# Patient Record
Sex: Male | Born: 1975 | Race: White | Hispanic: No | Marital: Single | Smoking: Current every day smoker
Health system: Southern US, Community
[De-identification: ages and names within clinical notes are randomized; demographics above are authoritative.]

## PROBLEM LIST (undated history)

## (undated) DIAGNOSIS — Z9151 Personal history of suicidal behavior: Secondary | ICD-10-CM

## (undated) DIAGNOSIS — F32A Depression, unspecified: Secondary | ICD-10-CM

## (undated) DIAGNOSIS — F209 Schizophrenia, unspecified: Secondary | ICD-10-CM

## (undated) DIAGNOSIS — F1910 Other psychoactive substance abuse, uncomplicated: Secondary | ICD-10-CM

## (undated) DIAGNOSIS — Z915 Personal history of self-harm: Secondary | ICD-10-CM

## (undated) DIAGNOSIS — F329 Major depressive disorder, single episode, unspecified: Secondary | ICD-10-CM

---

## 2013-02-25 ENCOUNTER — Emergency Department (HOSPITAL_COMMUNITY)
Admission: EM | Admit: 2013-02-25 | Discharge: 2013-02-25 | Disposition: A | Discharge status: Home / Self Care | Payer: Self-pay | Attending: Emergency Medicine | Admitting: Emergency Medicine

## 2013-02-25 ENCOUNTER — Encounter (HOSPITAL_COMMUNITY): Payer: Self-pay | Admitting: Emergency Medicine

## 2013-02-25 DIAGNOSIS — R21 Rash and other nonspecific skin eruption: Secondary | ICD-10-CM

## 2013-02-25 DIAGNOSIS — R2241 Localized swelling, mass and lump, right lower limb: Secondary | ICD-10-CM

## 2013-02-25 DIAGNOSIS — R229 Localized swelling, mass and lump, unspecified: Secondary | ICD-10-CM | POA: Insufficient documentation

## 2013-02-25 DIAGNOSIS — B351 Tinea unguium: Secondary | ICD-10-CM

## 2013-02-25 LAB — URINALYSIS, ROUTINE W REFLEX MICROSCOPIC
Bilirubin Urine: NEGATIVE
Glucose, UA: NEGATIVE mg/dL
HGB URINE DIPSTICK: NEGATIVE
KETONES UR: NEGATIVE mg/dL
Leukocytes, UA: NEGATIVE
Nitrite: NEGATIVE
Protein, ur: NEGATIVE mg/dL
Specific Gravity, Urine: 1.003 — ABNORMAL LOW (ref 1.005–1.030)
UROBILINOGEN UA: 0.2 mg/dL (ref 0.0–1.0)
pH: 5.5 (ref 5.0–8.0)

## 2013-02-25 MED ORDER — OXYCODONE-ACETAMINOPHEN 5-325 MG PO TABS
1.0000 | ORAL_TABLET | Freq: Once | ORAL | Status: AC
  Administered 2013-02-25: 1 via ORAL
  Filled 2013-02-25: qty 1

## 2013-02-25 MED ORDER — NAPROXEN 500 MG PO TABS: 500.0000 mg | ORAL_TABLET | Freq: Two times a day (BID) | ORAL | Status: DC

## 2013-02-25 MED ORDER — HYDROCORTISONE 2.5 % EX LOTN: TOPICAL_LOTION | Freq: Two times a day (BID) | CUTANEOUS | Status: DC

## 2013-02-25 MED ORDER — CLOTRIMAZOLE 1 % EX CREA: TOPICAL_CREAM | CUTANEOUS | Status: DC

## 2013-02-25 MED ORDER — OXYCODONE-ACETAMINOPHEN 5-325 MG PO TABS: 1.0000 | ORAL_TABLET | Freq: Four times a day (QID) | ORAL | Status: DC | PRN

## 2013-02-25 MED ORDER — TRAMADOL HCL 50 MG PO TABS: 50.0000 mg | ORAL_TABLET | Freq: Four times a day (QID) | ORAL | Status: DC | PRN

## 2013-02-25 MED ORDER — TERBINAFINE HCL 250 MG PO TABS: 250.0000 mg | ORAL_TABLET | Freq: Every day | ORAL | Status: DC

## 2013-02-25 NOTE — ED Notes (Signed)
Pt resting quietly at the time. Vital signs stable. 8/10 pain to R upper leg abscess. No signs of acute distress noted at present. Pt up walking around in room.

## 2013-02-25 NOTE — ED Notes (Signed)
Pt has golf ball size abscess on right leg, pt has pain there and also in his groin area

## 2013-02-25 NOTE — Discharge Instructions (Signed)
Follow up with Primary Care Physician regarding the mass of your leg.  Apply warm compresses to the area.  Take Ibuprofen or Naproxen for the pain.  Return to the Emergency Department if you develop redness or warmth of the area.  Return if you develop a fever.    Use hydrocortisone cream for the rash of your legs.   Emergency Department Resource Guide 1) Find a Doctor and Pay Out of Pocket Although you won't have to find out who is covered by your insurance plan, it is a good idea to ask around and get recommendations. You will then need to call the office and see if the doctor you have chosen will accept you as a new patient and what types of options they offer for patients who are self-pay. Some doctors offer discounts or will set up payment plans for their patients who do not have insurance, but you will need to ask so you aren't surprised when you get to your appointment.  2) Contact Your Local Health Department Not all health departments have doctors that can see patients for sick visits, but many do, so it is worth a call to see if yours does. If you don't know where your local health department is, you can check in your phone book. The CDC also has a tool to help you locate your state's health department, and many state websites also have listings of all of their local health departments.  3) Find a Walk-in Clinic If your illness is not likely to be very severe or complicated, you may want to try a walk in clinic. These are popping up all over the country in pharmacies, drugstores, and shopping centers. They're usually staffed by nurse practitioners or physician assistants that have been trained to treat common illnesses and complaints. They're usually fairly quick and inexpensive. However, if you have serious medical issues or chronic medical problems, these are probably not your best option.  No Primary Care Doctor: - Call Health Connect at  [phone] - they can help you locate a primary  care doctor that  accepts your insurance, provides certain services, etc. - Physician Referral Service- [phone]  Chronic Pain Problems: Organization         Address  Phone   Notes  [facility]  [phone]) [phone] Patients need to be referred by their primary care doctor.   Medication Assistance: Organization         Address  Phone   Notes  [facility] Medication [facility] [address] [place], [place] [phone]) [phone] --Must be a resident of [place] [facility] -- Must have NO insurance coverage whatsoever (no Medicaid/ Medicare, etc.) -- The pt. MUST have a primary care doctor that directs their care regularly and follows them in the community   MedAssist  [phone]) [phone]   [employer]  [phone]    Agencies that provide inexpensive medical care: Organization         Address  Phone   Notes  [facility]  [phone]) [phone]   [facility]    [phone]) [phone]   [facility] [address] [place], [place] [address] [phone]) [phone]   [facility] [address], [place] [phone]   Planned Parenthood    [phone]) [phone]   [facility]    [phone]) [phone]   [facility] and [facility]  [address], [place] Phone:  (  [phone], Fax:  [phone] Hours of Operation:  9 am - 6 pm, M-F.  Also accepts Medicaid/Medicare and self-pay.  [facility] for [place], [address], [place] Phone: [phone], Fax: [phone]. Hours of Operation:  8:30 am - 5:30 pm, M-F.  Also accepts Medicaid and self-pay.  [facility] [place] [address] [place] Phone: [phone]   [place], [place], [place] [phone], Ext. [phone] Mondays & Thursdays: 7-9 AM.  First 15 patients are seen on a first come, first serve basis.    [place]  Providers:  Organization         Address  Phone   Notes  [facility] [address], [place] [phone] Also accepts self-pay patients.  [facility] [address], [place]  [phone]   [place], [address], [place] [phone]   [facility] [address], [place] [phone]   Lucianne Lei [address], [place]   [phone] Only accepts [place] Access [place] patients after they have their name applied to their card.   Self-Pay (no insurance) in [facility]:  Organization         Address  Phone   Notes  Sickle Cell Patients, [facility] [place] [phone]   [facility] [place] [phone]   [facility] [place]  [place], [place], [place] [phone]   [facility]/Dr. Osei-Bonsu  [address] [place] or [address], [place] [phone] Phone number for both [place] and [place] locations is the same.  [facility] [address], [place] [phone]   [facility] [address] [place] or [address] [phone]   [facility] [address] [place] [phone], phone; [phone], fax Sees patients 1st and 3rd Saturday of every month.  Must not qualify for public or private insurance (i.e. Medicaid, Medicare, [place] Health Choice, Veterans' Benefits)  Household income should be no more than 200% of the poverty level The clinic cannot treat you if you are pregnant or think you are pregnant  Sexually transmitted diseases are not treated at the clinic.    Dental Care: Organization         Address  Phone  Notes  [facility] [phone] Accepts children up to age 2 who are enrolled in [place] or [place]; pregnant women with a Medicaid card; and children who have applied for Medicaid or [place] Health Choice, but were declined, whose parents can pay a reduced fee at time of service.  [facility] of [facility]  [address], [place] [phone] Accepts children up to age 72 who are enrolled in [place] or [place]; pregnant women with a Medicaid card; and children who have applied for Medicaid or [place] Health Choice, but were declined, whose parents can pay a reduced fee at time of service.  [facility] Access PROGRAM  [place] [phone] Patients are seen by appointment only. Walk-ins  are not accepted. [place] will see patients 39 years of age and older. Monday - Tuesday (8am-5pm) Most Wednesdays (8:30-5pm) $30 per visit, cash only  [facility] Adult Dental Access PROGRAM  [address], [facility] [phone] Patients are seen by appointment only. Walk-ins are not accepted. [place] [facility] will see patients 56 years of age and older. One Wednesday Evening (Monthly: Volunteer Based).  $30 per visit, cash only  [place]  [phone] for adults; Children under age 3, call Graduate Pediatric Dentistry at [phone]. Children aged 85-14, please call [phone] to request a pediatric application.  Dental services are provided in all areas of dental care including fillings, crowns and bridges, complete and partial dentures, implants, gum treatment, root canals, and extractions. Preventive care is also provided. Treatment is provided to both adults and children. Patients are selected via a lottery and there is often a waiting list.   [facility] [address], [place]  [phone] [email or web address]   [facility]  [address] [place], [place] [phone], Ext. [phone] Second and Fourth Thursday of each month, opens at 6:30 AM; Clinic ends at 9 AM.  Patients are seen on a first-come first-served basis, and a limited number are seen during each clinic.   [facility]  [address] Hillard Danker [place], [place] [phone]   Eligibility Requirements You must have lived in [place], [place], or [place] counties for at least the last three months.   You cannot be eligible for state or federal sponsored [employer], including [employer], [place], or [employer].   You generally cannot be eligible for healthcare insurance through your employer.    How to apply: Eligibility screenings are held every Tuesday and Wednesday afternoon from 1:00 pm until 4:00 pm. You do not need an appointment for the interview!  [facility] [address] [place], [place]   [place]  [place] Department  [place]  [phone]    Behavioral Health Resources in the Community: Intensive Outpatient Programs Organization         Address  Phone  Notes  [place]. [address] [place], [place] [phone]   [facility] Outpatient [address] [place], [place]   ADS: Alcohol & Drug Svcs [address], [place], [place]   [place] [address],  [place], [place] or [phone]   Substance Abuse Resources Organization         Address  Phone  Notes  Alcohol and Drug Services  [phone]   [place]  [phone]   [facility]   Chinita Pester  [phone]   Residential & Outpatient Substance Abuse Program  [phone]   Psychological Services Organization         Address  Phone  Notes  [facility]  [phone]   [place] [address] [place] [phone] or [phone]    Mobile Crisis Teams Organization         Address  Phone  Notes  [facility], Mobile Crisis Care Unit  [phone]   [facility] Psychotherapeutic [facility]  [address] [place], [place]   [facility] [address], [place] [phone]  Self-Help/Support Groups Organization         Address  Phone             Notes  [facility] [place] - variety of support groups  336- [phone] Call for more information  Narcotics Anonymous (NA), [facility] [address], [place]  2 meetings at this location   Statisticianesidential Treatment Programs Organization         Address  Phone  Notes  [facility] [address],    [place]  [phone]   [facility]  [address], [place] [phone], [place], [place] [phone]   [facility] [address] [place] [address], [place] [phone] Admissions: 8am-3pm M-F  [facility] [address]    [place], [place] [phone]   [facility] [address], [place], [place] [phone]   [facility] [address]  [place], [place] [phone]   [facility] - Intensive Outpatient [address], [place], [place] [phone]   [facility] ([facility] [address]  [place], [place] [phone] or [phone]   [facility] (RTS) [address] [place], [place] [phone] Accepts Medicaid  [facility] [address]  [place] [phone] Substance Abuse/Addiction Treatment   [facility] Behavioral Health Resources Organization         Address  Phone  Notes  [facility]  [phone]) [phone]   Angie FavaJulie Brannon, PhD [address], [address] [place], [place]   [phone] or [phone](336) [phone]    [facility]   [address] [place], [place] [phone](336) [phone]   [facility] [address], [place], [place] [phone]) [phone] Insurance/Medicaid/sponsorship through [facility]  [facility] Families [address]                                    [place], [place] [phone]) [phone] Therapy/tele-psych/case  [facility] [address].   [place], [place] [phone](336) [phone]    Dr. Lolly MustacheArfeen  [phone]) [phone]   [facility]  [employer] Way [facility] 1) [address], [place] 2) [address], [place] 3)  [address], [place] [phone]) [phone](336) [phone]  [phone](336) [phone]   [facility] Child Abuse Hotline [phone](336) [phone] or [phone](336) [phone] (After Hours)

## 2013-02-25 NOTE — ED Provider Notes (Signed)
Medical screening examination/treatment/procedure(s) were performed by non-physician practitioner and as supervising physician I was immediately available for consultation/collaboration.  EKG Interpretation   None         Tania Steinhauser H Emanual Lamountain, MD 02/25/13 2258 

## 2013-02-25 NOTE — ED Provider Notes (Signed)
CSN: [id number]     Arrival date & time 02/25/13  0544 History   First MD Initiated Contact with Patient 02/25/13 0606     Chief Complaint  Patient presents with  . Abscess    Right leg   (Consider location/radiation/quality/duration/timing/severity/associated sxs/prior Treatment) HPI Comments: Patient presents with a tender bump on his right lateral thigh.  He reports that the area has been there for the past 2 months and is gradually increasing in size.  He has been taking Ibuprofen for the pain, but does not feel that it helps.  He has not noticed any redness or warmth of the area.  He denies any drainage from the area.  No injury.  He denies fever or chills. Patient also is complaining of a rash to both legs that has been present for the past 4 days.  Rash does itch.  He has not tried any treatment for the rash.  He denies any new use of medications, detergents, lotions, or soaps.  No contacts with similar rash. He is also complaining of a yellow discoloration and thickening of his toe nails that has also been present for months.  No treatment prior to arrival. Patient has not been seen by a medical provider for any of the above complaints.    The history is provided by the patient.    History reviewed. No pertinent past medical history. History reviewed. No pertinent past surgical history. History reviewed. No pertinent family history. History  Substance Use Topics  . Smoking status: Not on file  . Smokeless tobacco: Not on file  . Alcohol Use: Not on file    Review of Systems  All other systems reviewed and are negative.    Allergies  Review of patient's allergies indicates no known allergies.  Home Medications   Current Outpatient Rx  Name  Route  Sig  Dispense  Refill  . ibuprofen (ADVIL,MOTRIN) 200 MG tablet   Oral   Take 200 mg by mouth every 6 (six) hours as needed for moderate pain.          BP 122/87  Pulse 88  Temp(Src) 98.7 F (37.1 C) (Oral)  Resp 18   SpO2 99% Physical Exam  Nursing note and vitals reviewed. Constitutional: He appears well-developed and well-nourished.  HENT:  Head: Normocephalic and atraumatic.  Mouth/Throat: Oropharynx is clear and moist.  Neck: Normal range of motion. Neck supple.  Cardiovascular: Normal rate, regular rhythm and normal heart sounds.   Pulses:      Dorsalis pedis pulses are 2+ on the right side, and 2+ on the left side.       Posterior tibial pulses are 2+ on the right side, and 2+ on the left side.  Pulmonary/Chest: Effort normal and breath sounds normal.  Musculoskeletal: Normal range of motion.  Neurological: He is alert.  Normal sensation of the right foot  Skin: Skin is warm and dry. No erythema.  Approximately 3-4 cm soft tissue mass of the right anterior thigh.  No fluctuance.  No erythema or warmth.  Area visualized with the bedside ultrasound.  No fluid collection visualized.  Surrounding area soft.    Diffuse erythematous papular rash of both legs. No drainage.  Thickening and yellow discoloration of all toes of both feet.  Psychiatric: He has a normal mood and affect.    ED Course  Procedures (including critical care time) Labs Review Labs Reviewed  URINALYSIS, ROUTINE W REFLEX MICROSCOPIC - Abnormal; Notable for the following:  Specific Gravity, Urine 1.003 (*)    All other components within normal limits   Imaging Review No results found.  EKG Interpretation   None       MDM   1. Mass of leg, right   2. Rash   3. Onychomycosis of toenail     Patient with several complaints.  He reports that he has had a bump on his right thigh for 2 months.  On exam, no fluctuance of the area.  No overlying erythema or warmth.  Area visualized with bedside ultrasound.  No fluid collection visualized.  No not feel that incision and drainage is indicated at this time.  Area not consistent with abscess.  Patient also with a rash of both legs.  Appearance of rash most consistent with  Contact Dermatitis.  Patient given Hydrocortisone cream to apply to rash.  Patient also with Onychomycosis of toenails.  Patient given Rx for antifungal.  Patient is stable for discharge.  Return precautions given.    Santiago GladHeather Maryanna Stuber, PA-C 02/25/13 1614

## 2013-03-31 ENCOUNTER — Emergency Department (HOSPITAL_COMMUNITY): Payer: Self-pay

## 2013-03-31 ENCOUNTER — Encounter (HOSPITAL_COMMUNITY): Payer: Self-pay | Admitting: Emergency Medicine

## 2013-03-31 ENCOUNTER — Emergency Department (HOSPITAL_COMMUNITY)
Admission: EM | Admit: 2013-03-31 | Discharge: 2013-03-31 | Disposition: A | Discharge status: Home / Self Care | Payer: Self-pay | Attending: Emergency Medicine | Admitting: Emergency Medicine

## 2013-03-31 DIAGNOSIS — R1031 Right lower quadrant pain: Secondary | ICD-10-CM

## 2013-03-31 DIAGNOSIS — R109 Unspecified abdominal pain: Secondary | ICD-10-CM | POA: Insufficient documentation

## 2013-03-31 DIAGNOSIS — M25559 Pain in unspecified hip: Secondary | ICD-10-CM | POA: Insufficient documentation

## 2013-03-31 DIAGNOSIS — F17200 Nicotine dependence, unspecified, uncomplicated: Secondary | ICD-10-CM | POA: Insufficient documentation

## 2013-03-31 LAB — URINALYSIS, ROUTINE W REFLEX MICROSCOPIC
Bilirubin Urine: NEGATIVE
Glucose, UA: NEGATIVE mg/dL
Hgb urine dipstick: NEGATIVE
Ketones, ur: 15 mg/dL — AB
Leukocytes, UA: NEGATIVE
Nitrite: NEGATIVE
Protein, ur: NEGATIVE mg/dL
Specific Gravity, Urine: 1.029 (ref 1.005–1.030)
Urobilinogen, UA: 0.2 mg/dL (ref 0.0–1.0)
pH: 5.5 (ref 5.0–8.0)

## 2013-03-31 LAB — POCT I-STAT, CHEM 8
BUN: 14 mg/dL (ref 6–23)
Calcium, Ion: 1.13 mmol/L (ref 1.12–1.23)
Chloride: 107 meq/L (ref 96–112)
Creatinine, Ser: 1.2 mg/dL (ref 0.50–1.35)
Glucose, Bld: 87 mg/dL (ref 70–99)
HCT: 51 % (ref 39.0–52.0)
Hemoglobin: 17.3 g/dL — ABNORMAL HIGH (ref 13.0–17.0)
Potassium: 3.5 meq/L — ABNORMAL LOW (ref 3.7–5.3)
Sodium: 145 meq/L (ref 137–147)
TCO2: 20 mmol/L (ref 0–100)

## 2013-03-31 MED ORDER — OXYCODONE-ACETAMINOPHEN 5-325 MG PO TABS
2.0000 | ORAL_TABLET | ORAL | Status: DC | PRN
Start: 2013-03-31 — End: 2013-04-16

## 2013-03-31 MED ORDER — NAPROXEN 500 MG PO TABS: 500.0000 mg | ORAL_TABLET | Freq: Two times a day (BID) | ORAL | Status: DC

## 2013-03-31 MED ORDER — IOHEXOL 300 MG/ML  SOLN
100.0000 mL | Freq: Once | INTRAMUSCULAR | Status: AC | PRN
  Administered 2013-03-31: 100 mL via INTRAVENOUS

## 2013-03-31 MED ORDER — OXYCODONE-ACETAMINOPHEN 5-325 MG PO TABS
2.0000 | ORAL_TABLET | Freq: Once | ORAL | Status: AC
  Administered 2013-03-31: 2 via ORAL
  Filled 2013-03-31: qty 2

## 2013-03-31 MED ORDER — IOHEXOL 300 MG/ML  SOLN
25.0000 mL | Freq: Once | INTRAMUSCULAR | Status: AC | PRN
  Administered 2013-03-31: 25 mL via ORAL

## 2013-03-31 NOTE — ED Notes (Signed)
Vital signs stable. 

## 2013-03-31 NOTE — ED Notes (Signed)
Per ems-- pt reports R groin pain radiating to hip, with dark colored urine and pain with urination x3 weeks. . No hx kidney stones.

## 2013-03-31 NOTE — ED Provider Notes (Signed)
CSN: [id number]     Arrival date & time 03/31/13  0037 History   First MD Initiated Contact with Patient 03/31/13 0114     Chief Complaint  Patient presents with  . Groin Pain   (Consider location/radiation/quality/duration/timing/severity/associated sxs/prior Treatment) HPI 38 yo male presents to the ER via EMS with complaint of right groin pain.  Pain has been ongoing for the last 4-5 weeks.  No known trauma or injury to the area.  Pain is worse with movement, but is always present.  Some radiation of pain down the inside of his thigh.  Occasionally he has noted darker urine.  Occasionally he has had some burning with urination.  No new sexual partners, no penile discharge.  Pt works as Administratorlandscaper.  No abdominal pain, no flank or back pain, no weakness, numbness, bowel changes or erectile problems.  No scrotal bulges or pain.  He has noticed some lumps in the groin crease.  History reviewed. No pertinent past medical history. History reviewed. No pertinent past surgical history. No family history on file. History  Substance Use Topics  . Smoking status: Current Every Day Smoker  . Smokeless tobacco: Not on file  . Alcohol Use: Yes     Comment: occasionally    Review of Systems  See History of Present Illness; otherwise all other systems are reviewed and negative Allergies  Review of patient's allergies indicates no known allergies.  Home Medications   Current Outpatient Rx  Name  Route  Sig  Dispense  Refill  . ibuprofen (ADVIL,MOTRIN) 200 MG tablet   Oral   Take 400 mg by mouth every 8 (eight) hours as needed for mild pain or moderate pain.           BP 123/78  Pulse 79  Temp(Src) 98.4 F (36.9 C) (Oral)  Resp 16  SpO2 100% Physical Exam  Nursing note and vitals reviewed. Constitutional: He is oriented to person, place, and time. He appears well-developed and well-nourished.  HENT:  Head: Normocephalic and atraumatic.  Right Ear: External ear normal.  Left Ear:  External ear normal.  Nose: Nose normal.  Mouth/Throat: Oropharynx is clear and moist.  Eyes: Conjunctivae and EOM are normal. Pupils are equal, round, and reactive to light.  Neck: Normal range of motion. Neck supple. No JVD present. No tracheal deviation present. No thyromegaly present.  Cardiovascular: Normal rate, regular rhythm, normal heart sounds and intact distal pulses.  Exam reveals no gallop and no friction rub.   No murmur heard. Pulmonary/Chest: Effort normal and breath sounds normal. No stridor. No respiratory distress. He has no wheezes. He has no rales. He exhibits no tenderness.  Abdominal: Soft. Bowel sounds are normal. He exhibits no distension and no mass. There is no tenderness. There is no rebound and no guarding.  Genitourinary:  Pain with palpation of right inguinal crease.  Shoddy LAD noted.  No hernia.  Normal scrotum, penis.    Musculoskeletal: Normal range of motion. He exhibits tenderness (ttp along right interior thigh). He exhibits no edema.  Lymphadenopathy:    He has no cervical adenopathy.  Neurological: He is alert and oriented to person, place, and time. He exhibits normal muscle tone. Coordination normal.  Skin: Skin is warm and dry. No rash noted. No erythema. No pallor.  Scattered abrasions to lower extremities  Psychiatric: He has a normal mood and affect. His behavior is normal. Judgment and thought content normal.    ED Course  Procedures (including critical care time) Labs  Review Labs Reviewed  URINALYSIS, ROUTINE W REFLEX MICROSCOPIC - Abnormal; Notable for the following:    Ketones, ur 15 (*)    All other components within normal limits  POCT I-STAT, CHEM 8 - Abnormal; Notable for the following:    Potassium 3.5 (*)    Hemoglobin 17.3 (*)    All other components within normal limits   Imaging Review Ct Abdomen Pelvis W Contrast  03/31/2013   CLINICAL DATA:  Groin pain and lymphadenopathy. Concern for malignancy.  EXAM: CT ABDOMEN AND  PELVIS WITH CONTRAST  TECHNIQUE: Multidetector CT imaging of the abdomen and pelvis was performed using the standard protocol following bolus administration of intravenous contrast.  CONTRAST:  OMNIPAQUE IOHEXOL 300 MG/ML  SOLN  COMPARISON:  None.  FINDINGS: BODY WALL: Unremarkable.  LOWER CHEST: Unremarkable.  ABDOMEN/PELVIS:  Liver: No focal abnormality.  Biliary: Cholelithiasis.  No acute cholecystitis.  Pancreas: Unremarkable.  Spleen: Unremarkable.  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  Kidneys and ureters: 17 mm simple appearing cyst in the lower pole right kidney. 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 There is no hydronephrosis or nephrolithiasis.  Bladder: Unremarkable.  Reproductive: Unremarkable.  Bowel: No obstruction. Normal appendix.  Retroperitoneum: No mass or adenopathy.  Peritoneum: No free fluid or gas.  Vascular: No acute abnormality.  OSSEOUS: No acute abnormalities. Benign-appearing sclerotic and lucent foci in the pelvis and proximal femora.  IMPRESSION: 1. No acute intra-abdominal abnormality.  No adenopathy. 2. Cholelithiasis without acute cholecystitis. 3. Ptotic left kidney.   Electronically Signed   By: Tiburcio Pea M.D.   On: 03/31/2013 03:52    EKG Interpretation   None       MDM   1. Right inguinal pain   2. Right groin pain    38 yo male with right groin pain.  Urine and chem 7 normal.  Some shoddy LAD to right groin.  Suspect reactive given abrasions to lower extremities, but I do have some concern for malignancy.  Will get contrasted abd/pelvis CT scan, treat pain.  5:14 AM Pain much improved.  Abnormal left kidney noted, cyst on right.  No signs of malignancy.  Will have patient f/u with ortho for probable groin strain.    Olivia Mackie, MD 03/31/13 [phone]

## 2013-03-31 NOTE — Discharge Instructions (Signed)
Your CAT scan does not show a specific cause for your right groin pain.  Your left kidney is in an abnormal position, but this is most likely to have been present since birth.  Take medications as prescribed.  Followup with orthopedic surgery for further workup and evaluation of your ongoing pain.  You may consider being seen by urology.  Given the abnormal position of your kidney.  Return to the emergency room for worsening condition or new concerning symptoms.

## 2013-03-31 NOTE — ED Notes (Signed)
Admitting MD at bedside.

## 2013-04-16 ENCOUNTER — Emergency Department (HOSPITAL_COMMUNITY)
Admission: EM | Admit: 2013-04-16 | Discharge: 2013-04-17 | Disposition: A | Discharge status: Home / Self Care | Payer: Self-pay | Attending: Emergency Medicine | Admitting: Emergency Medicine

## 2013-04-16 ENCOUNTER — Encounter (HOSPITAL_COMMUNITY): Payer: Self-pay | Admitting: Emergency Medicine

## 2013-04-16 DIAGNOSIS — M7918 Myalgia, other site: Secondary | ICD-10-CM

## 2013-04-16 DIAGNOSIS — R109 Unspecified abdominal pain: Secondary | ICD-10-CM | POA: Insufficient documentation

## 2013-04-16 DIAGNOSIS — R1030 Lower abdominal pain, unspecified: Secondary | ICD-10-CM

## 2013-04-16 DIAGNOSIS — IMO0001 Reserved for inherently not codable concepts without codable children: Secondary | ICD-10-CM | POA: Insufficient documentation

## 2013-04-16 DIAGNOSIS — F17200 Nicotine dependence, unspecified, uncomplicated: Secondary | ICD-10-CM | POA: Insufficient documentation

## 2013-04-16 MED ORDER — OXYCODONE-ACETAMINOPHEN 5-325 MG PO TABS: 2.0000 | ORAL_TABLET | ORAL | Status: DC | PRN

## 2013-04-16 MED ORDER — IBUPROFEN 600 MG PO TABS: 600.0000 mg | ORAL_TABLET | Freq: Four times a day (QID) | ORAL | Status: DC | PRN

## 2013-04-16 NOTE — ED Notes (Signed)
Pt states that he has been having groin pain x "few months". Pt states that he has seen a doctor, and they gave him pain medication. Pt states "I don't need that I just need to get this fixed".

## 2013-04-16 NOTE — ED Notes (Signed)
The pt is c/o rt groin pain for several months and he has been seen here for the same

## 2013-04-16 NOTE — ED Provider Notes (Addendum)
CSN: [id number]     Arrival date & time 04/16/13  1933 History   None    Chief Complaint  Patient presents with  . Groin Pain     (Consider location/radiation/quality/duration/timing/severity/associated sxs/prior Treatment) HPI  This patient is a generally healthy 38 year old man who presents to the emergency department for the third time in the past couple of months with complaints of vague right-sided groin pain. He denies any history of trauma or strain. He was worked up very thoroughly for this complaint by Dr. Norlene Campbelltter about one month ago. He is CT of the pelvis to evaluate for possible malignancy in light of right inguinal adenopathy. as was essentially normal study. Patient had a normal U/A.   The patient says his pain persistent. It is worse with walking. He wants relieve. It still hurts. He has not followed up with any of the resources/providers to whom he has been referred.   Pain is aching, moderately severe, non radiating.  History reviewed. No pertinent past medical history. History reviewed. No pertinent past surgical history. No family history on file. History  Substance Use Topics  . Smoking status: Current Every Day Smoker  . Smokeless tobacco: Not on file  . Alcohol Use: Yes     Comment: occasionally    Review of Systems Ten point review of symptoms performed and is negative with the exception of symptoms noted above.     Allergies  Review of patient's allergies indicates no known allergies.  Home Medications   Current Outpatient Rx  Name  Route  Sig  Dispense  Refill  . ibuprofen (ADVIL,MOTRIN) 200 MG tablet   Oral   Take 400 mg by mouth every 8 (eight) hours as needed for mild pain or moderate pain.           BP 113/69  Pulse 87  Temp(Src) 97.9 F (36.6 C) (Oral)  Resp 20  SpO2 97% Physical Exam Gen: well developed and well nourished appearing Head: NCAT Eyes: PERL, EOMI Nose: no epistaixis or rhinorrhea Mouth/throat: mucosa is moist and  pink Neck: supple, no stridor Lungs: CTA B, no wheezing, rhonchi or rales CV: RRR, no murmur, extremities appear well perfused.  Abd: soft, notender, nondistended Back: no ttp, no cva ttp Skin: warm and dry GU: normal appearing circumsized penis without discharge at meatus, no inguinal adenopathy appreciated. NO lesions.  Ext: normal to inspection, ttp on palpation of the psoas and ileopsoas on the right. Pain with external and internal rotation (passively).  FROM at hip, NVI,  no dependent edema Neuro: CN ii-xii grossly intact, no focal deficits Psyche; normal affect,  calm and cooperative.   ED Course  Procedures (including critical care time) Labs Review   MDM   Maxwell [name] is here for the third time in the past 60 days with complaints of the same day right sided groin pain. His evaluation is consistent with hip flexor m. Strain. I have educated him on some basic stretching excersizes and suggested that he explore yoga or physical therapy for more in depth tx of muscular pain. I've explained to him that we have exhausted our resources for any further emergent eval of his pain in the emergency department. We will treat his acute pain. I have described for him the importance of outpatient followup with primary care provider. He is stable for discharge    Brandt LoosenJulie Baer Hinton, MD 04/17/13 0001  Brandt LoosenJulie Cherron Blitzer, MD 04/17/13 [phone]

## 2013-04-16 NOTE — ED Notes (Signed)
Lavella LemonsManly, MD and Lars MageI Knapp, MD made aware of the pt's wait time.

## 2013-04-17 MED ORDER — OXYCODONE-ACETAMINOPHEN 5-325 MG PO TABS
1.0000 | ORAL_TABLET | Freq: Once | ORAL | Status: AC
  Administered 2013-04-17: 1 via ORAL
  Filled 2013-04-17: qty 1

## 2013-04-25 ENCOUNTER — Encounter (HOSPITAL_COMMUNITY): Payer: Self-pay | Admitting: Emergency Medicine

## 2013-04-25 ENCOUNTER — Emergency Department (HOSPITAL_COMMUNITY)
Admission: EM | Admit: 2013-04-25 | Discharge: 2013-04-26 | Disposition: A | Discharge status: Home / Self Care | Payer: Self-pay | Attending: Emergency Medicine | Admitting: Emergency Medicine

## 2013-04-25 DIAGNOSIS — R05 Cough: Secondary | ICD-10-CM | POA: Insufficient documentation

## 2013-04-25 DIAGNOSIS — R229 Localized swelling, mass and lump, unspecified: Secondary | ICD-10-CM | POA: Insufficient documentation

## 2013-04-25 DIAGNOSIS — R2240 Localized swelling, mass and lump, unspecified lower limb: Secondary | ICD-10-CM

## 2013-04-25 DIAGNOSIS — R059 Cough, unspecified: Secondary | ICD-10-CM | POA: Insufficient documentation

## 2013-04-25 DIAGNOSIS — F17200 Nicotine dependence, unspecified, uncomplicated: Secondary | ICD-10-CM | POA: Insufficient documentation

## 2013-04-25 NOTE — ED Notes (Signed)
Pt states he has a knot on his right thigh for one week.  Area is not warm to touch or reddened, but it tender to palpation.

## 2013-04-25 NOTE — ED Provider Notes (Signed)
CSN: [id number]     Arrival date & time 04/25/13  2243 History  This chart was scribed for non-physician practitioner Lowella DellGray Anayia Eugene, PA-C working with Layla MawKristen N Ward, DO by Danella Maiersaroline Early, ED Scribe. This patient was seen in room TR09C/TR09C and the patient's care was started at 11:57 PM.    Chief Complaint  Patient presents with  . Leg Pain   The history is provided by the patient. No language interpreter was used.   HPI Comments: Maxwell [name] is a 38 y.o. male who presents to the Emergency Department complaining of gradually-worsening, constant, right outer thigh pain with a knot to the area for the past week. He rates the severity of the pain as an 8/10. He states the pain worsens when he applies pressure. He denies new sexual partners, penile discharge, testicular pain, dysuria, fever chills nausea vomiting dizziness.      History reviewed. No pertinent past medical history. History reviewed. No pertinent past surgical history. No family history on file. History  Substance Use Topics  . Smoking status: Current Every Day Smoker    Types: Cigarettes  . Smokeless tobacco: Not on file  . Alcohol Use: Yes     Comment: occasionally    Review of Systems  Respiratory: Positive for cough.   All other systems reviewed and are negative.      Allergies  Review of patient's allergies indicates no known allergies.  Home Medications   Current Outpatient Rx  Name  Route  Sig  Dispense  Refill  . HYDROcodone-acetaminophen (NORCO/VICODIN) 5-325 MG per tablet   Oral   Take 1-2 tablets by mouth every 4 (four) hours as needed.   6 tablet   0   . naproxen (NAPROSYN) 500 MG tablet   Oral   Take 1 tablet (500 mg total) by mouth 2 (two) times daily with a meal.   30 tablet   0    BP 132/68  Pulse 91  Temp(Src) 97.6 F (36.4 C) (Oral)  Resp 18  SpO2 98% Physical Exam  Nursing note and vitals reviewed. Constitutional: He is oriented to person, place, and time. He appears  well-developed and well-nourished. No distress.  HENT:  Head: Normocephalic and atraumatic.  Eyes: Conjunctivae are normal.  Neck: No JVD present. No tracheal deviation present.  Cardiovascular: Normal rate and regular rhythm.  Exam reveals no gallop and no friction rub.   No murmur heard. Pulmonary/Chest: Effort normal. No respiratory distress. He has no wheezes. He has no rhonchi. He has no rales.  Musculoskeletal: Normal range of motion. He exhibits no edema.  Neurological: He is alert and oriented to person, place, and time.  Skin: Skin is warm and dry. He is not diaphoretic.     Psychiatric: He has a normal mood and affect. His behavior is normal.    ED Course  US bedside Date/Time: 04/25/2013 11:54 PM Performed by: Rudene AndaLACKEY, Leani Myron, GRAY Authorized by: Rudene AndaLACKEY, Myshawn [place]ir[place], GRAY Consent: Verbal consent obtained. Risks and benefits: risks, benefits and alternatives were discussed Consent given by: patient Patient identity confirmed: verbally with patient Time out: Immediately prior to procedure a "time out" was called to verify the correct patient, procedure, equipment, support staff and site/side marked as required. Preparation: Patient was prepped and draped in the usual sterile fashion. Patient tolerance: Patient tolerated the procedure well with no immediate complications. Comments: Right lateral mid thigh Ultrasound reveals no evidence of fluid or abscess formation.    (including critical care time) Medications - No data  to display  DIAGNOSTIC STUDIES: Oxygen Saturation is 98% on RA, normal by my interpretation.    COORDINATION OF CARE: 12:00 AM- Discussed treatment plan with pt which includes bedside US. Pt agrees to plan.    Labs Review Labs Reviewed - No data to display Imaging Review No results found.   EKG Interpretation None      MDM   Final diagnoses:  Leg mass   Patient afebrile with normal VS.  NO evidence of infections, abscess, or cellulitis  Plan  to treat patient pain and have follow up with General Surgery for further evaluation and management of mass.   Patient agrees with plan. Discharged in good condition.   Meds given in ED:  Medications - No data to display  Discharge Medication List as of 04/26/2013 12:32 AM    START taking these medications   Details  traMADol (ULTRAM) 50 MG tablet Take 1 tablet (50 mg total) by mouth every 6 (six) hours as needed., Starting 04/26/2013, Until Discontinued, Print       I personally performed the services described in this documentation, which was scribed in my presence. The recorded information has been reviewed and is accurate.   Rudene Anda, PA-C 04/28/13 1313

## 2013-04-26 MED ORDER — HYDROCODONE-ACETAMINOPHEN 5-325 MG PO TABS: 1.0000 | ORAL_TABLET | ORAL | Status: DC | PRN

## 2013-04-26 MED ORDER — TRAMADOL HCL 50 MG PO TABS: 50.0000 mg | ORAL_TABLET | Freq: Four times a day (QID) | ORAL | Status: DC | PRN

## 2013-04-26 MED ORDER — NAPROXEN 500 MG PO TABS: 500.0000 mg | ORAL_TABLET | Freq: Two times a day (BID) | ORAL | Status: DC

## 2013-04-26 NOTE — Discharge Instructions (Signed)
Make appointment with General Surgery for further evaluation and management of right leg mass. Return to ED should you develop any worsening symptoms.    Emergency Department Resource Guide 1) Find a Doctor and Pay Out of Pocket Although you won't have to find out who is covered by your insurance plan, it is a good idea to ask around and get recommendations. You will then need to call the office and see if the doctor you have chosen will accept you as a new patient and what types of options they offer for patients who are self-pay. Some doctors offer discounts or will set up payment plans for their patients who do not have insurance, but you will need to ask so you aren't surprised when you get to your appointment.  2) Contact Your Local Health Department Not all health departments have doctors that can see patients for sick visits, but many do, so it is worth a call to see if yours does. If you don't know where your local health department is, you can check in your phone book. The CDC also has a tool to help you locate your state's health department, and many state websites also have listings of all of their local health departments.  3) Find a Walk-in Clinic If your illness is not likely to be very severe or complicated, you may want to try a walk in clinic. These are popping up all over the country in pharmacies, drugstores, and shopping centers. They're usually staffed by nurse practitioners or physician assistants that have been trained to treat common illnesses and complaints. They're usually fairly quick and inexpensive. However, if you have serious medical issues or chronic medical problems, these are probably not your best option.  No Primary Care Doctor: - Call Health Connect at  [phone] - they can help you locate a primary care doctor that  accepts your insurance, provides certain services, etc. - Physician Referral Service- [phone]  Chronic Pain Problems: Organization          Address  Phone   Notes  [facility]  [phone] Patients need to be referred by their primary care doctor.   Medication Assistance: Organization         Address  Phone   Notes  [facility] [address] [place], [place] [address] [phone] --Must be a resident of [place] [place] -- Must have NO insurance coverage whatsoever (no Medicaid/ Medicare, etc.) -- The pt. MUST have a primary care doctor that directs their care regularly and follows them in the community   MedAssist  [phone]   [employer]  [phone]    Agencies that provide inexpensive medical care: Organization         Address  Phone   Notes  [facility]  [phone]   [facility]    [phone]   [facility] [address] [place], [place] [address] [phone]   [facility] [address], [place] [phone]) [phone]   Planned Parenthood    [phone]   [facility]    [phone]   [facility] and [facility]  [address], [place] Phone:  [phone], Fax:  [phone] Hours of Operation:  9 am - 6 pm, M-F.  Also accepts Medicaid/Medicare and self-pay.  [facility]  [address], [place] Phone: (  [phone], Fax: [phone]. Hours of Operation:  8:30 am - 5:30 pm, M-F.  Also accepts Medicaid and self-pay.  [facility] [place] [address], [place] Phone: [phone]   [place], [place], [place] [phone], Ext. [phone] Mondays & Thursdays: 7-9 AM.  First 15 patients are seen on a first come, first serve basis.    [place] Providers:  Organization         Address  Phone   Notes  [facility] [address], [place] [phone] Also accepts self-pay patients.  [facility] [address], [place]  [phone]   [place], [address], [place] [phone]   [facility] [address] [place] [phone]   Lucianne Lei [address], [place]   [phone] Only accepts [place] Access [place] patients after they have their name applied to their card.   Self-Pay (no insurance) in [facility]:  Organization         Address  Phone   Notes  Sickle Cell Patients, [facility] [place] [phone]   [facility] [place] [phone]   [facility] [place]  [place], [place], [place] [phone]   [facility]/Dr. Osei-Bonsu  [address] [place] or [address], [place] [phone] Phone number for both [place] and [place] locations is the same.  Urgent Medical and [facility] [address], [place] [phone]   [facility] [address] [place] or [address] [phone]   [facility] [address] [place] [phone], phone; [phone], fax Sees patients 1st and 3rd Saturday of every month.  Must not qualify for public or private insurance (i.e. Medicaid, Medicare, [place] Health Choice, Veterans' Benefits)  Household income should be no more than 200% of the poverty level The clinic cannot treat you if you are pregnant or think you are pregnant  Sexually transmitted diseases are not treated at the clinic.    Dental Care: Organization         Address  Phone  Notes  [facility] Department of [facility] [place] [phone] Accepts children up to age 28 who are enrolled in [place] or [place]; pregnant women with a Medicaid card; and  children who have applied for Medicaid or [place] Health Choice, but were declined, whose parents can pay a reduced fee at time of service.  [facility] Department of [facility]  [address], [place] [phone] Accepts children up to age 15 who are enrolled in [place] or [place]; pregnant women with a Medicaid card; and children who have applied for Medicaid or [place] Health Choice, but were declined, whose parents can pay a reduced fee at time of service.  [facility] [place] [facility] Access PROGRAM  [place] [phone] Patients are seen by appointment only. Walk-ins are not accepted. [place] will see patients 11 years of age and older. Monday - Tuesday (8am-5pm) Most Wednesdays (8:30-5pm) $30 per visit, cash only  [facility]  [address],  [place] [phone] Patients are seen by appointment only. Walk-ins are not accepted. [place] will see patients 54 years of age and older. One Wednesday Evening (Monthly: Volunteer Based).  $30 per visit, cash only  [place]  [phone] for adults; Children under age 55, call Graduate Pediatric Dentistry at [phone]. Children aged 23-14, please call [phone] to request a pediatric application.  Dental services are provided in all areas of dental care including fillings, crowns and bridges, complete and partial dentures, implants, gum treatment, root canals, and extractions. Preventive care is also provided. Treatment is provided to both adults and children. Patients are selected via a lottery and there is often a waiting list.   [facility] [address] [place]  [phone] [email or web address]   [facility] [address] [place], [place] [phone], Ext. [phone] Second and Fourth Thursday of each month, opens at 6:30 AM; Clinic ends at 9 AM.  Patients are seen on a first-come first-served  basis, and a limited number are seen during each clinic.   [facility]  [address] [place], [place] [phone]   Eligibility Requirements You must have lived in [place], [place], or [place] counties for at least the last three months.   You cannot be eligible for state or federal sponsored [employer], including [employer], [place], or [employer].   You generally cannot be eligible for healthcare insurance through your employer.    How to apply: Eligibility screenings are held every Tuesday and Wednesday afternoon from 1:00 pm until 4:00 pm. You do not need an appointment for the interview!  [facility] [address] [place], [place]   [place]  [place] Department  [place]  [phone]    Behavioral Health Resources in the Community: Intensive Outpatient Programs Organization         Address  Phone  Notes  [place] Hunter. [address] [place], [place] [phone]   [facility]patient [address], [place], [place]   ADS: Alcohol & Drug Svcs [address], [place], [place]   [place] [address],  [place], [place] or [phone]   Substance Abuse Resources Organization         Address  Phone  Notes  Alcohol and Drug Services  [phone]   [place]  [phone]   [facility] [place]   Chinita Pester  [phone]   Residential & Outpatient Substance Abuse Program  [phone]   Psychological Services Organization         Address  Phone  Notes  [facility] [place]  [place]  [phone]   [place] [address], [place] [employer] or [phone]    Mobile Crisis Teams Organization          Address  Phone  Notes  [facility], Mobile Crisis Care Unit  [phone]   [facility]  [address] [place], [place]   [place] Levels [address], [place] [phone]    Self-Help/Support Groups Organization         Address  Phone             Notes  [place] of [place] - variety of support  groups  [phone] Call for more information  Narcotics Anonymous (NA), [facility] [address], [employer] [place]  2 meetings at this location   Residential Facilities manager         Address  Phone  Notes  [facility] [place],    [place]  [phone]   [facility]  [address], [place] [id number], [place], [place]   [place], [place] [phone] Admissions: 8am-3pm M-F  Incentives Substance [place] [address]    [place], [place] [phone]   [facility] [address] [place], [place], [place]   [facility] [address]  [place], [place]   [facility]ight Programs - Intensive Outpatient [address], [place], [place]   [facility] [address].,  [place], [place] [phone] or [phone]   [facility]idential Treatment Services (RTS) [address] [place], [place] Accepts Medicaid  [facility] [place] [address]  [place] [phone] Substance Abuse/Addiction Treatment   [facility] The Organization         Address  Phone  Notes  [facility]  [phone]   Domenic Schwab, PhD [address] Arlis [place], [place]   [phone] or [phone]   [place], [place] [phone]   [facility] [address] [place], [place] [phone] Insurance/Medicaid/sponsorship  through [facility] and Families [address]                                    [place], [place] [phone] [place] [address], [place] [phone]    Dr. Adele Schilder  [phone]   [facility] Dept. 1) [address] [place] 2) [place] 3)  [address], [place] [phone]  [phone]   [place] [phone] or [phone] (After Hours)

## 2013-04-28 NOTE — ED Provider Notes (Signed)
Medical screening examination/treatment/procedure(s) were performed by non-physician practitioner and as supervising physician I was immediately available for consultation/collaboration.   EKG Interpretation None        Pharell Rolfson N Stevon Gough, DO 04/28/13 1503 

## 2014-11-13 ENCOUNTER — Emergency Department (HOSPITAL_COMMUNITY)
Admission: EM | Admit: 2014-11-13 | Discharge: 2014-11-16 | Disposition: A | Discharge status: Other Acute Inpatient Hospital | Payer: Self-pay | Attending: Emergency Medicine | Admitting: Emergency Medicine

## 2014-11-13 ENCOUNTER — Encounter (HOSPITAL_COMMUNITY): Payer: Self-pay | Admitting: Emergency Medicine

## 2014-11-13 ENCOUNTER — Emergency Department (HOSPITAL_COMMUNITY)
Admission: EM | Admit: 2014-11-13 | Discharge: 2014-11-13 | Discharge status: Against Medical Advice | Payer: Self-pay | Attending: Emergency Medicine | Admitting: Emergency Medicine

## 2014-11-13 DIAGNOSIS — Z720 Tobacco use: Secondary | ICD-10-CM | POA: Insufficient documentation

## 2014-11-13 DIAGNOSIS — R360 Urethral discharge without blood: Secondary | ICD-10-CM | POA: Insufficient documentation

## 2014-11-13 DIAGNOSIS — F329 Major depressive disorder, single episode, unspecified: Secondary | ICD-10-CM | POA: Insufficient documentation

## 2014-11-13 DIAGNOSIS — Z791 Long term (current) use of non-steroidal anti-inflammatories (NSAID): Secondary | ICD-10-CM | POA: Insufficient documentation

## 2014-11-13 DIAGNOSIS — F32A Depression, unspecified: Secondary | ICD-10-CM

## 2014-11-13 DIAGNOSIS — F29 Unspecified psychosis not due to a substance or known physiological condition: Secondary | ICD-10-CM | POA: Insufficient documentation

## 2014-11-13 DIAGNOSIS — R1030 Lower abdominal pain, unspecified: Secondary | ICD-10-CM | POA: Insufficient documentation

## 2014-11-13 DIAGNOSIS — F22 Delusional disorders: Secondary | ICD-10-CM | POA: Insufficient documentation

## 2014-11-13 DIAGNOSIS — F419 Anxiety disorder, unspecified: Secondary | ICD-10-CM | POA: Insufficient documentation

## 2014-11-13 DIAGNOSIS — R110 Nausea: Secondary | ICD-10-CM | POA: Insufficient documentation

## 2014-11-13 HISTORY — DX: Other psychoactive substance abuse, uncomplicated: F19.10

## 2014-11-13 HISTORY — DX: Personal history of suicidal behavior: Z91.51

## 2014-11-13 HISTORY — DX: Personal history of self-harm: Z91.5

## 2014-11-13 NOTE — ED Notes (Addendum)
Pt. reports penile discharge with dysuria , pain at penis and groin for several months , denies fever or chills.

## 2014-11-13 NOTE — ED Notes (Addendum)
After speaking with OD [employer] officer for extended period of time, pt wanted [place] to draw his blood. Pt stated, "I think I have something in my system." Pt was willing to allow this writer to draw his blood. While drawing blood, pt stood with needle still in his arm. I withdrew the needle safely and placed a bandage on it. Pt took off tourniquet, stated, "I cant do this anymore." Pt left. RN Lanora Manis B notified

## 2014-11-13 NOTE — ED Notes (Signed)
Pt is acting paranoid in the hallway saying, "they gave me something a few days ago. I don't know what it was. I don't want want to be arrested. I'm good, nothing is wrong with me. I'm good." Patient educated on need to stay, have lab work drawn, and speak to someone before leaving. No other c/c.

## 2014-11-13 NOTE — ED Notes (Signed)
Multiple attempts made by this writer and [place] [employer] officer to get pt to triage.  Pt making statements in reference to people coming to get him. Pt asked [employer] OD officer, "man, just please take your gun and shoot me. I can't deal with this." When asked what he was talking about pt states, "I hear people talking about me." Pt not able to say who said people are.  Pt continuously stating, "man, just don't let me die. I'm not safe." Finally able to get pt to triage room. Was attempting to take V/S. Only able to obtain pulse and O2 Sat. Pt still very unwilling to stay.  This Clinical research associate, [employer], and [place] [employer] officer currently making attempts to make pt feel safe, and convince him to stay and receive help.

## 2014-11-14 LAB — CBC WITH DIFFERENTIAL/PLATELET
BASOS PCT: 1 %
Basophils Absolute: 0 10*3/uL (ref 0.0–0.1)
EOS ABS: 0.1 10*3/uL (ref 0.0–0.7)
Eosinophils Relative: 1 %
HEMATOCRIT: 41.6 % (ref 39.0–52.0)
Hemoglobin: 14.1 g/dL (ref 13.0–17.0)
Lymphocytes Relative: 17 %
Lymphs Abs: 1.1 10*3/uL (ref 0.7–4.0)
MCH: 32.4 pg (ref 26.0–34.0)
MCHC: 33.9 g/dL (ref 30.0–36.0)
MCV: 95.6 fL (ref 78.0–100.0)
MONOS PCT: 11 %
Monocytes Absolute: 0.7 10*3/uL (ref 0.1–1.0)
Neutro Abs: 4.5 10*3/uL (ref 1.7–7.7)
Neutrophils Relative %: 70 %
Platelets: 272 10*3/uL (ref 150–400)
RBC: 4.35 MIL/uL (ref 4.22–5.81)
RDW: 15.5 % (ref 11.5–15.5)
WBC: 6.4 10*3/uL (ref 4.0–10.5)

## 2014-11-14 LAB — URINALYSIS, ROUTINE W REFLEX MICROSCOPIC
Glucose, UA: NEGATIVE mg/dL
Ketones, ur: 15 mg/dL — AB
Nitrite: NEGATIVE
Protein, ur: 30 mg/dL — AB
Specific Gravity, Urine: 1.034 — ABNORMAL HIGH (ref 1.005–1.030)
UROBILINOGEN UA: 1 mg/dL (ref 0.0–1.0)
pH: 5.5 (ref 5.0–8.0)

## 2014-11-14 LAB — COMPREHENSIVE METABOLIC PANEL
ALBUMIN: 3.9 g/dL (ref 3.5–5.0)
ALK PHOS: 92 U/L (ref 38–126)
ALT: 25 U/L (ref 17–63)
AST: 37 U/L (ref 15–41)
Anion gap: 10 (ref 5–15)
BILIRUBIN TOTAL: 0.8 mg/dL (ref 0.3–1.2)
BUN: 13 mg/dL (ref 6–20)
CALCIUM: 9.2 mg/dL (ref 8.9–10.3)
CO2: 20 mmol/L — ABNORMAL LOW (ref 22–32)
CREATININE: 0.87 mg/dL (ref 0.61–1.24)
Chloride: 107 mmol/L (ref 101–111)
GFR calc Af Amer: >60 mL/min (ref >60)
GFR calc non Af Amer: >60 mL/min (ref >60)
GLUCOSE: 107 mg/dL — AB (ref 65–99)
Potassium: 3.5 mmol/L (ref 3.5–5.1)
Sodium: 137 mmol/L (ref 135–145)
Total Protein: 6.8 g/dL (ref 6.5–8.1)

## 2014-11-14 LAB — RAPID URINE DRUG SCREEN, HOSP PERFORMED
Amphetamines: POSITIVE — AB
BARBITURATES: NOT DETECTED
Benzodiazepines: POSITIVE — AB
COCAINE: NOT DETECTED
OPIATES: NOT DETECTED
Tetrahydrocannabinol: POSITIVE — AB

## 2014-11-14 LAB — URINE MICROSCOPIC-ADD ON

## 2014-11-14 LAB — SALICYLATE LEVEL: Salicylate Lvl: <4 mg/dL (ref 2.8–30.0)

## 2014-11-14 LAB — ETHANOL: Alcohol, Ethyl (B): <5 mg/dL (ref <5)

## 2014-11-14 LAB — ACETAMINOPHEN LEVEL: Acetaminophen (Tylenol), Serum: <10 ug/mL — ABNORMAL LOW (ref 10–30)

## 2014-11-14 MED ORDER — LORAZEPAM 1 MG PO TABS: 1.0000 mg | ORAL_TABLET | ORAL | Status: DC | PRN

## 2014-11-14 MED ORDER — ONDANSETRON HCL 4 MG PO TABS: 4.0000 mg | ORAL_TABLET | Freq: Three times a day (TID) | ORAL | Status: DC | PRN

## 2014-11-14 MED ORDER — IBUPROFEN 400 MG PO TABS: 600.0000 mg | ORAL_TABLET | Freq: Three times a day (TID) | ORAL | Status: DC | PRN

## 2014-11-14 MED ORDER — LORAZEPAM 1 MG PO TABS
1.0000 mg | ORAL_TABLET | Freq: Once | ORAL | Status: DC
  Filled 2014-11-14: qty 1

## 2014-11-14 MED ORDER — HALOPERIDOL LACTATE 5 MG/ML IJ SOLN
5.0000 mg | Freq: Once | INTRAMUSCULAR | Status: AC
  Administered 2014-11-14: 5 mg via INTRAMUSCULAR
  Filled 2014-11-14: qty 1

## 2014-11-14 MED ORDER — HALOPERIDOL 5 MG PO TABS
5.0000 mg | ORAL_TABLET | Freq: Two times a day (BID) | ORAL | Status: DC
  Administered 2014-11-14 – 2014-11-16 (×5): 5 mg via ORAL
  Filled 2014-11-14 (×5): qty 1

## 2014-11-14 MED ORDER — HALOPERIDOL 5 MG PO TABS
5.0000 mg | ORAL_TABLET | Freq: Once | ORAL | Status: DC
  Filled 2014-11-14: qty 1

## 2014-11-14 MED ORDER — NICOTINE 21 MG/24HR TD PT24
21.0000 mg | MEDICATED_PATCH | Freq: Every day | TRANSDERMAL | Status: DC
  Administered 2014-11-14 – 2014-11-16 (×4): 21 mg via TRANSDERMAL
  Filled 2014-11-14 (×4): qty 1

## 2014-11-14 MED ORDER — LORAZEPAM 2 MG/ML IJ SOLN
2.0000 mg | Freq: Once | INTRAMUSCULAR | Status: AC
  Administered 2014-11-14: 2 mg via INTRAMUSCULAR
  Filled 2014-11-14: qty 1

## 2014-11-14 NOTE — ED Notes (Signed)
Patient sleeping

## 2014-11-14 NOTE — ED Notes (Signed)
Patient to be IVC'd.  Staffing notified of need of sitter.

## 2014-11-14 NOTE — ED Provider Notes (Signed)
CSN: [id number]   Arrival date & time 11/13/14 2338  History  This chart was scribed for Maxwell Rhine, MD by Bethel Born, ED Scribe. This patient was seen in room B18C/B18C and the patient's care was started at 2:21 AM.  Chief Complaint  Patient presents with  . Penile Discharge  . Dysuria  LEVEL 5 CAVEAT DUE TO PSYCHIATRIC ILLNESS  HPI Patient is a 39 y.o. male presenting with mental health disorder. The history is provided by the patient. No language interpreter was used.  Mental Health Problem Presenting symptoms: agitation and hallucinations   Onset quality:  Unable to specify Timing:  Unable to specify Progression:  Unable to specify  Maxwell [name] is a 39 y.o. male who presents to the Emergency Department complaining of auditory hallucinations and states "I'm losing my mind, it's not drugs". Also complains of penile discharge. He denies sexual intercourse. Associated symptoms include penile pain, groin pain, and nausea.  No other details are known at arrival  pmh - UNKNOWN  Social History  Substance Use Topics  . Smoking status: Current Every Day Smoker    Types: Cigarettes  . Smokeless tobacco: None  . Alcohol Use: Yes     Comment: occasionally     Review of Systems  Unable to perform ROS: Psychiatric disorder  Constitutional: Negative for fever.  Gastrointestinal: Positive for nausea.  Genitourinary: Positive for discharge and penile pain.  Psychiatric/Behavioral: Positive for hallucinations and agitation.   Home Medications   Prior to Admission medications   Medication Sig Start Date End Date Taking? Authorizing Provider  HYDROcodone-acetaminophen (NORCO/VICODIN) 5-325 MG per tablet Take 1-2 tablets by mouth every 4 (four) hours as needed. 04/26/13   Cristobal Goldmann, PA-C  naproxen (NAPROSYN) 500 MG tablet Take 1 tablet (500 mg total) by mouth 2 (two) times daily with a meal. 04/26/13   Cristobal Goldmann, PA-C    Allergies  Review of patient's allergies indicates  no known allergies.  Triage Vitals: BP 142/93 mmHg  Pulse 93  Temp(Src) 98.3 F (36.8 C) (Oral)  Resp 22  Ht  (1.753 m)  Wt 144 lb 5 oz (65.46 kg)  BMI 21.30 kg/m2  SpO2 100%  Physical Exam CONSTITUTIONAL: Disheveled, anxious HEAD: Normocephalic/atraumatic EYES: EOMI ENMT: Mucous membranes moist NECK: supple no meningeal signs CV: S1/S2 noted, no murmurs/rubs/gallops noted LUNGS: Lungs are clear to auscultation bilaterally, no apparent distress ABDOMEN: soft XL:[id number] due to patient condition NEURO: Pt is awake/alert/appropriate, moves all extremitiesx4.  No facial droop.   EXTREMITIES:  full ROM SKIN: warm, color normal PSYCH: Anxious and agitated, pt reporting people are "tapping phones" and he is going to report it to [employer].  Pt with flight of ideas.  He makes poor eye contact  ED Course  Procedures  CRITICAL CARE Performed by: Joya Gaskins Total critical care time: 33 Critical care time was exclusive of separately billable procedures and treating other patients. Critical care was necessary to treat or prevent imminent or life-threatening deterioration. Critical care was time spent personally by me on the following activities: development of treatment plan with patient and/or surrogate as well as nursing, discussions with consultants, evaluation of patient's response to treatment, examination of patient, obtaining history from patient or surrogate, ordering and performing treatments and interventions, ordering and review of laboratory studies,  pulse oximetry and re-evaluation of patient's condition. PATIENT WITH PSYCHOSIS REQUIRING HALDOL FOR SEDATION   DIAGNOSTIC [id number] Saturation is 100% on RA, normal by my interpretation.    COORDINATION OF  CARE: 2:24 AM Treatment plan which includes lab work and TTS consult. Pt clearly psychotic, exhibiting paranoid behaviour and threat to himself IVC initiated Psych consult pending He required IM  haldol for his agitation Police at bedside 6:39 AM Pt resting comfortably Labs reassuring Pt is medically stable.  Vitals appropriate.   He will need admission for psychosis D/w TTS, will likely be admitted later today BP 118/87 mmHg  Pulse 83  Temp(Src) 97.8 F (36.6 C) (Oral)  Resp 18  Ht  (1.753 m)  Wt 144 lb 5 oz (65.46 kg)  BMI 21.30 kg/m2  SpO2 99%  Labs Reviewed  COMPREHENSIVE METABOLIC PANEL - Abnormal; Notable for the following:    CO2 20 (*)    Glucose, Bld 107 (*)    All other components within normal limits  ACETAMINOPHEN LEVEL - Abnormal; Notable for the following:    Acetaminophen (Tylenol), Serum <10 (*)    All other components within normal limits  ETHANOL  CBC WITH DIFFERENTIAL/PLATELET  SALICYLATE LEVEL  URINE RAPID DRUG SCREEN, HOSP PERFORMED  URINALYSIS, ROUTINE W REFLEX MICROSCOPIC (NOT AT [facility])  GC/CHLAMYDIA PROBE AMP ([place]) NOT AT [facility]   Medications  LORazepam (ATIVAN) tablet 1 mg (1 mg Oral Not Given 11/14/14 0302)  haloperidol (HALDOL) tablet 5 mg (5 mg Oral Not Given 11/14/14 0302)  nicotine (NICODERM CQ - dosed in mg/24 hours) patch 21 mg (21 mg Transdermal Patch Applied 11/14/14 0305)  haloperidol lactate (HALDOL) injection 5 mg (5 mg Intramuscular Given 11/14/14 0348)  LORazepam (ATIVAN) injection 2 mg (2 mg Intramuscular Given 11/14/14 0348)     MDM   Final diagnoses:  None    Labs/vital reviewed myself and considered during evaluation  Nursing notes including past medical history and social history reviewed and considered in documentation   I personally performed the services described in this documentation, which was scribed in my presence. The recorded information has been reviewed and is accurate.     Maxwell Rhine, MD 11/14/14 [phone]

## 2014-11-14 NOTE — ED Notes (Signed)
Breakfast ordered 

## 2014-11-14 NOTE — ED Notes (Signed)
Lunch tray has arrived; patient is still asleep on stretcher

## 2014-11-14 NOTE — ED Notes (Signed)
Charge Nurse and FT nurse are aware he is back in fast track.

## 2014-11-14 NOTE — ED Provider Notes (Signed)
Patient is clearly psychotic. Tangential speech, auditory hallucinations, thinks everyone is out to hurt him.  No suicidal or homicidal etiology. Screening labs ordered. Will be need to be moved to main treatment area for monitoring and disposition.  Felicie Morn, NP 11/14/14 0146  Layla Maw Ward, DO 11/14/14 [phone]

## 2014-11-14 NOTE — ED Notes (Signed)
Relieving sitter for a break; patient appears to be asleep on stretcher

## 2014-11-14 NOTE — Progress Notes (Signed)
Seeking inpatient placement for pt as recommended by psychiatry. Considered for admission to [facility] upon bed availability (500 hall).  Referred to the following facitilies with expected bed availability today [place]- per [facility]- per [facility]- per [facility] Romney (no beds currently, will review for waiting list) [facility]- will review if beds become available per Scot Dock, MSW, LCSW Clinical Social Work, Disposition  11/14/2014 [phone]

## 2014-11-14 NOTE — ED Notes (Signed)
Patient was given a snack and drink. 
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
## 2014-11-14 NOTE — Progress Notes (Signed)
Patient on the waitlist at [facility], per [facility].  Referral was followed up at: [facility] - left voicemail and call back number. [place] - no answer.  At capacity: [facility]   CSW will continue to follow up.  Melbourne Abts, LCSWA Disposition staff 11/14/2014 5:59 PM

## 2014-11-14 NOTE — ED Notes (Signed)
Pt returned to room from waiting area after returning to front desk.

## 2014-11-14 NOTE — ED Notes (Signed)
Patient eloped from his room. This RN went to waiting room to find patient with no success.

## 2014-11-14 NOTE — BH Assessment (Signed)
Spoke with nurse to determine appropriateness for roommate and if pt is still display psychosis. Per RN when reports was given she was told pt was pleasant and cooperative but slept almost all day, so sx levels are not currently known. She will call this Clinical research associate with an update if possible.    Clista Bernhardt, [facility] Triage Specialist 11/14/2014 9:17 PM

## 2014-11-14 NOTE — ED Notes (Signed)
Prompted for urine specimen.

## 2014-11-14 NOTE — ED Notes (Signed)
Patient became extremely paranoid and wanted to smoke a cigarette, requested to go outside.  Patient told by charge RN that he could not go outside to smoke, or else he'd have to leave AMA.  Patient signed AMA form and was about to walk outside when Dr. Bebe Shaggy talked him into coming back to his room.  Dr. Bebe Shaggy to IVC patient at this time.

## 2014-11-14 NOTE — ED Notes (Signed)
Patient refused Ativan and Haldol PO.  MD made aware.

## 2014-11-14 NOTE — BH Assessment (Signed)
Reviewed ED notes prior to initiating assessment. Pt came earlier on 11-13-14 and was paranoid and staff could not get him to triage. Pt was roomed and became paranoid and wanted to leave AM and was placed under IVX. reprots he is losing his mind, feels people are out to get him.   Requested cart be placed for assessment.   Assessment to commence shortly.    Clista Bernhardt, [facility] Triage Specialist 11/14/2014 4:07 AM

## 2014-11-14 NOTE — ED Notes (Signed)
Sitter has returned from break; patient is still asleep on stretcher

## 2014-11-15 ENCOUNTER — Encounter (HOSPITAL_COMMUNITY): Payer: Self-pay | Admitting: *Deleted

## 2014-11-15 LAB — GC/CHLAMYDIA PROBE AMP (~~LOC~~) NOT AT ARMC
Chlamydia: NEGATIVE
NEISSERIA GONORRHEA: POSITIVE — AB

## 2014-11-15 LAB — HIV ANTIBODY (ROUTINE TESTING W REFLEX): HIV Screen 4th Generation wRfx: NONREACTIVE

## 2014-11-15 LAB — RPR: RPR: NONREACTIVE

## 2014-11-15 NOTE — ED Notes (Signed)
IVC paperwork - original placed in folder for Magistrate, copy sent to medical records and copy faxed to [facility]. Copy of [employer]'s Observation form sent to Medical Records - original on clipboard chart.

## 2014-11-15 NOTE — Progress Notes (Signed)
Followed up on inpatient placement efforts.  Under review for admission to [facility] (500 hall) upon bed availability. On waiting list at [facility]- per Kathlene November, no beds today, unknown at this time what bed status will be tomorrow. (Declined at [facility] and [facility] due to acuity being too high for unit.)  All other facilities contacted are at capacity for appropriate beds today.   Ilean Skill, MSW, LCSW Clinical Social Work, Disposition  11/15/2014 [phone]

## 2014-11-15 NOTE — ED Provider Notes (Signed)
Patient here with depression, suicidal ideation. But refused to come into the ED to be evaluated. I was unable to evaluate patient.   Richardean Canal, MD 11/15/14 [phone]

## 2014-11-16 ENCOUNTER — Encounter (HOSPITAL_COMMUNITY): Payer: Self-pay

## 2014-11-16 ENCOUNTER — Inpatient Hospital Stay (HOSPITAL_COMMUNITY)
Admission: AD | Admit: 2014-11-16 | Discharge: 2014-11-21 | DRG: 885 | Disposition: A | Discharge status: Home / Self Care | Payer: Federal, State, Local not specified - Other | Source: Intra-hospital | Attending: Psychiatry | Admitting: Psychiatry

## 2014-11-16 ENCOUNTER — Telehealth (HOSPITAL_BASED_OUTPATIENT_CLINIC_OR_DEPARTMENT_OTHER): Payer: Self-pay | Admitting: Emergency Medicine

## 2014-11-16 DIAGNOSIS — F1110 Opioid abuse, uncomplicated: Secondary | ICD-10-CM | POA: Diagnosis present

## 2014-11-16 DIAGNOSIS — F1910 Other psychoactive substance abuse, uncomplicated: Secondary | ICD-10-CM | POA: Diagnosis present

## 2014-11-16 DIAGNOSIS — F410 Panic disorder [episodic paroxysmal anxiety] without agoraphobia: Secondary | ICD-10-CM | POA: Diagnosis present

## 2014-11-16 DIAGNOSIS — F1994 Other psychoactive substance use, unspecified with psychoactive substance-induced mood disorder: Secondary | ICD-10-CM | POA: Diagnosis present

## 2014-11-16 DIAGNOSIS — F1020 Alcohol dependence, uncomplicated: Secondary | ICD-10-CM | POA: Diagnosis not present

## 2014-11-16 DIAGNOSIS — F333 Major depressive disorder, recurrent, severe with psychotic symptoms: Principal | ICD-10-CM | POA: Diagnosis present

## 2014-11-16 DIAGNOSIS — F323 Major depressive disorder, single episode, severe with psychotic features: Secondary | ICD-10-CM | POA: Diagnosis not present

## 2014-11-16 DIAGNOSIS — F17200 Nicotine dependence, unspecified, uncomplicated: Secondary | ICD-10-CM | POA: Diagnosis present

## 2014-11-16 DIAGNOSIS — Z62810 Personal history of physical and sexual abuse in childhood: Secondary | ICD-10-CM | POA: Diagnosis present

## 2014-11-16 DIAGNOSIS — F4310 Post-traumatic stress disorder, unspecified: Secondary | ICD-10-CM | POA: Diagnosis present

## 2014-11-16 DIAGNOSIS — F17210 Nicotine dependence, cigarettes, uncomplicated: Secondary | ICD-10-CM | POA: Diagnosis present

## 2014-11-16 DIAGNOSIS — G4700 Insomnia, unspecified: Secondary | ICD-10-CM | POA: Diagnosis present

## 2014-11-16 DIAGNOSIS — F1220 Cannabis dependence, uncomplicated: Secondary | ICD-10-CM | POA: Diagnosis not present

## 2014-11-16 DIAGNOSIS — F1390 Sedative, hypnotic, or anxiolytic use, unspecified, uncomplicated: Secondary | ICD-10-CM | POA: Diagnosis not present

## 2014-11-16 DIAGNOSIS — R45851 Suicidal ideations: Secondary | ICD-10-CM | POA: Diagnosis present

## 2014-11-16 DIAGNOSIS — F1590 Other stimulant use, unspecified, uncomplicated: Secondary | ICD-10-CM | POA: Clinically undetermined

## 2014-11-16 DIAGNOSIS — F1410 Cocaine abuse, uncomplicated: Secondary | ICD-10-CM | POA: Diagnosis present

## 2014-11-16 DIAGNOSIS — F1320 Sedative, hypnotic or anxiolytic dependence, uncomplicated: Secondary | ICD-10-CM | POA: Clinically undetermined

## 2014-11-16 DIAGNOSIS — Z8619 Personal history of other infectious and parasitic diseases: Secondary | ICD-10-CM | POA: Diagnosis present

## 2014-11-16 DIAGNOSIS — F19959 Other psychoactive substance use, unspecified with psychoactive substance-induced psychotic disorder, unspecified: Secondary | ICD-10-CM

## 2014-11-16 DIAGNOSIS — F1010 Alcohol abuse, uncomplicated: Secondary | ICD-10-CM

## 2014-11-16 MED ORDER — CEFTRIAXONE SODIUM 250 MG IJ SOLR
250.0000 mg | Freq: Once | INTRAMUSCULAR | Status: AC
  Administered 2014-11-16: 250 mg via INTRAMUSCULAR
  Filled 2014-11-16: qty 250

## 2014-11-16 MED ORDER — LORAZEPAM 1 MG PO TABS
ORAL_TABLET | ORAL | Status: AC
  Administered 2014-11-16: 1 mg via ORAL
  Filled 2014-11-16: qty 1

## 2014-11-16 MED ORDER — HYDROXYZINE HCL 25 MG PO TABS
25.0000 mg | ORAL_TABLET | Freq: Three times a day (TID) | ORAL | Status: DC | PRN
  Administered 2014-11-17 – 2014-11-20 (×3): 25 mg via ORAL
  Filled 2014-11-16: qty 20
  Filled 2014-11-16 (×3): qty 1

## 2014-11-16 MED ORDER — PENICILLIN G BENZATHINE 1200000 UNIT/2ML IM SUSP: 2.4000 10*6.[IU] | Freq: Once | INTRAMUSCULAR | Status: DC

## 2014-11-16 MED ORDER — ACETAMINOPHEN 325 MG PO TABS
650.0000 mg | ORAL_TABLET | Freq: Four times a day (QID) | ORAL | Status: DC | PRN
  Administered 2014-11-19: 650 mg via ORAL
  Filled 2014-11-16: qty 2

## 2014-11-16 MED ORDER — AZITHROMYCIN 250 MG PO TABS
1000.0000 mg | ORAL_TABLET | Freq: Once | ORAL | Status: AC
  Administered 2014-11-16: 1000 mg via ORAL
  Filled 2014-11-16: qty 4

## 2014-11-16 MED ORDER — DIPHENHYDRAMINE HCL 25 MG PO CAPS
25.0000 mg | ORAL_CAPSULE | Freq: Every evening | ORAL | Status: DC | PRN
  Administered 2014-11-16: 25 mg via ORAL

## 2014-11-16 MED ORDER — ALUM & MAG HYDROXIDE-SIMETH 200-200-20 MG/5ML PO SUSP: 30.0000 mL | ORAL | Status: DC | PRN

## 2014-11-16 MED ORDER — LORAZEPAM 1 MG PO TABS
1.0000 mg | ORAL_TABLET | Freq: Four times a day (QID) | ORAL | Status: DC | PRN
  Administered 2014-11-16: 1 mg via ORAL

## 2014-11-16 MED ORDER — PNEUMOCOCCAL VAC POLYVALENT 25 MCG/0.5ML IJ INJ
0.5000 mL | INJECTION | INTRAMUSCULAR | Status: AC
  Administered 2014-11-17: 0.5 mL via INTRAMUSCULAR

## 2014-11-16 MED ORDER — INFLUENZA VAC SPLIT QUAD 0.5 ML IM SUSY
0.5000 mL | PREFILLED_SYRINGE | INTRAMUSCULAR | Status: AC
  Administered 2014-11-17: 0.5 mL via INTRAMUSCULAR
  Filled 2014-11-16: qty 0.5

## 2014-11-16 MED ORDER — LIDOCAINE HCL (PF) 1 % IJ SOLN
0.9000 mL | Freq: Once | INTRAMUSCULAR | Status: AC
  Administered 2014-11-16: 0.9 mL
  Filled 2014-11-16: qty 5

## 2014-11-16 MED ORDER — DIPHENHYDRAMINE HCL 25 MG PO CAPS
ORAL_CAPSULE | ORAL | Status: AC
  Administered 2014-11-16: 25 mg via ORAL
  Filled 2014-11-16: qty 1

## 2014-11-16 MED ORDER — MAGNESIUM HYDROXIDE 400 MG/5ML PO SUSP: 30.0000 mL | Freq: Every day | ORAL | Status: DC | PRN

## 2014-11-16 NOTE — Progress Notes (Signed)
Pt is a 39 year old male admitted with depression and Etoh abuse   He reports drinking ETOH daily and is having active withdrawl symptoms which include agitation, anxiety, tremors , voices and sweats and chills   He was treated for gonorrhea in the ED prior to coming to [facility]   He has a rash on his lower back and abdomen that looks like scabies and has been asked to stay in his room until it has been confirmed and treated    Pt reports suicidal ideation with no plan and no intent   He is currently homeless and has been sleeping in the woods in a tent   He reports using several different illegal drugs and ETOH   He is wanting long term treatment  He is anxious , guarded but cooperative and polite during the assessment   NP notified of scabies suspicion and received no roommate order   Verbal support given  Medications administered and effectiveness monitored   Q 15 min checks explained and implemented   Nourishment given   Pt is adjusting well and verbalizes understanding of instructions and teachings

## 2014-11-16 NOTE — ED Notes (Signed)
Patient requesting to take a shower. Patient provided with hygiene supplies and escorted to shower by his sitter. Patient remains, calm and pleasant at this time.

## 2014-11-16 NOTE — ED Notes (Addendum)
Cattia, CSW called advising that patient has a bed, 508-1 at [facility] and report can be called after 1900. Dr. Elna Breslow accepting physician.

## 2014-11-16 NOTE — ED Notes (Signed)
Dr. Gentry at bedside. 

## 2014-11-16 NOTE — ED Notes (Signed)
Megan from [facility] advised patient should be getting a bed at [facility] once there is a room available.

## 2014-11-16 NOTE — ED Notes (Signed)
Patient reports red, itchy rash around lower back and upper buttocks. Pt reports he thinks rash began 1-2 days ago. This RN advised I would let physician know about rash.

## 2014-11-16 NOTE — ED Notes (Signed)
Dr. Littie Deeds aware of patient's red, itching rash that began 1-2 days ago.

## 2014-11-16 NOTE — ED Notes (Signed)
Dinner tray to bedside

## 2014-11-16 NOTE — Tx Team (Signed)
Initial Interdisciplinary Treatment Plan   PATIENT STRESSORS: Financial difficulties Medication change or noncompliance Substance abuse   PATIENT STRENGTHS: General fund of knowledge Motivation for treatment/growth   PROBLEM LIST: Problem List/Patient Goals Date to be addressed Date deferred Reason deferred Estimated date of resolution  I want help for drinking and depression                                                       DISCHARGE CRITERIA:  Improved stabilization in mood, thinking, and/or behavior Verbal commitment to aftercare and medication compliance Withdrawal symptoms are absent or subacute and managed without 24-hour nursing intervention  PRELIMINARY DISCHARGE PLAN: Attend 12-step recovery group Placement in alternative living arrangements  PATIENT/FAMIILY INVOLVEMENT: This treatment plan has been presented to and reviewed with the patient, Maxwell [name], and/or family member, .  The patient and family have been given the opportunity to ask questions and make suggestions.  Andrena Mews 11/16/2014, 10:46 PM

## 2014-11-16 NOTE — ED Notes (Signed)
This RN called for sheriff's dept to transfer.

## 2014-11-16 NOTE — ED Notes (Signed)
Patient up to restroom with sitter escorting him. Pt appears calm, cooperative, pleasant when speaking to this RN.

## 2014-11-16 NOTE — Progress Notes (Addendum)
Per [facility], patient accepted at [facility], to Dr. Elna Breslow, to bed 508-1; patient can arrive after 19:00. Call report at [phone]. RN [facility] informed.  Melbourne Abts, LCSWA Disposition staff 11/16/2014 4:29 PM

## 2014-11-16 NOTE — ED Notes (Signed)
Patient made aware that he will be going to [facility] for treatment, patient remains pleasant, calm and cooperative.

## 2014-11-17 DIAGNOSIS — F1994 Other psychoactive substance use, unspecified with psychoactive substance-induced mood disorder: Secondary | ICD-10-CM | POA: Diagnosis present

## 2014-11-17 DIAGNOSIS — F323 Major depressive disorder, single episode, severe with psychotic features: Secondary | ICD-10-CM | POA: Diagnosis present

## 2014-11-17 DIAGNOSIS — F1910 Other psychoactive substance abuse, uncomplicated: Secondary | ICD-10-CM | POA: Diagnosis present

## 2014-11-17 DIAGNOSIS — R45851 Suicidal ideations: Secondary | ICD-10-CM

## 2014-11-17 DIAGNOSIS — F4310 Post-traumatic stress disorder, unspecified: Secondary | ICD-10-CM

## 2014-11-17 MED ORDER — TRAZODONE HCL 50 MG PO TABS
50.0000 mg | ORAL_TABLET | Freq: Every evening | ORAL | Status: DC | PRN
  Administered 2014-11-17 – 2014-11-18 (×2): 50 mg via ORAL
  Filled 2014-11-17 (×2): qty 1

## 2014-11-17 MED ORDER — NICOTINE 21 MG/24HR TD PT24
21.0000 mg | MEDICATED_PATCH | Freq: Every day | TRANSDERMAL | Status: DC
  Administered 2014-11-17 – 2014-11-21 (×4): 21 mg via TRANSDERMAL
  Filled 2014-11-17 (×8): qty 1

## 2014-11-17 MED ORDER — ADULT MULTIVITAMIN W/MINERALS CH
1.0000 | ORAL_TABLET | Freq: Every day | ORAL | Status: DC
  Administered 2014-11-17 – 2014-11-21 (×4): 1 via ORAL
  Filled 2014-11-17 (×7): qty 1

## 2014-11-17 MED ORDER — BACITRACIN-NEOMYCIN-POLYMYXIN 400-5-5000 EX OINT
TOPICAL_OINTMENT | CUTANEOUS | Status: AC
  Administered 2014-11-17: 21:00:00
  Filled 2014-11-17: qty 1

## 2014-11-17 MED ORDER — LORAZEPAM 1 MG PO TABS
1.0000 mg | ORAL_TABLET | Freq: Four times a day (QID) | ORAL | Status: DC | PRN
  Administered 2014-11-17: 1 mg via ORAL
  Filled 2014-11-17: qty 1

## 2014-11-17 MED ORDER — ONDANSETRON 4 MG PO TBDP: 4.0000 mg | ORAL_TABLET | Freq: Four times a day (QID) | ORAL | Status: AC | PRN

## 2014-11-17 MED ORDER — VITAMIN B-1 100 MG PO TABS
100.0000 mg | ORAL_TABLET | Freq: Every day | ORAL | Status: DC
  Administered 2014-11-19 – 2014-11-21 (×3): 100 mg via ORAL
  Filled 2014-11-17 (×6): qty 1

## 2014-11-17 MED ORDER — THIAMINE HCL 100 MG/ML IJ SOLN: 100.0000 mg | Freq: Once | INTRAMUSCULAR | Status: DC

## 2014-11-17 MED ORDER — HYDROCORTISONE 1 % EX OINT
TOPICAL_OINTMENT | Freq: Three times a day (TID) | CUTANEOUS | Status: DC
  Administered 2014-11-17 – 2014-11-20 (×5): via TOPICAL
  Administered 2014-11-20: 1 via TOPICAL
  Administered 2014-11-20: 17:00:00 via TOPICAL
  Filled 2014-11-17 (×2): qty 28.35

## 2014-11-17 MED ORDER — ESCITALOPRAM OXALATE 5 MG PO TABS
5.0000 mg | ORAL_TABLET | Freq: Every day | ORAL | Status: DC
  Administered 2014-11-17 – 2014-11-19 (×2): 5 mg via ORAL
  Filled 2014-11-17 (×4): qty 1

## 2014-11-17 MED ORDER — LOPERAMIDE HCL 2 MG PO CAPS: 2.0000 mg | ORAL_CAPSULE | ORAL | Status: AC | PRN

## 2014-11-17 NOTE — Progress Notes (Addendum)
D:  Patient's self inventory sheet, patient slept poor last night, sleep medication was not helpful.  Good appetite, low energy level, poor concentration.  Rated depression 6, denied hopeless and anxiety at this time.  Still has cravings.  Denied SI and HI.  Rash on body improving, no itching.  Denied physical pain.  Goal is to feel better physically and rest today.  Would not attend afternoon group. A:  Emotional support and encouragement given patient. R:  Denied SI and HI, contracts for safety.  Denied A/V hallucinations.  Denied pain.  Patient has not left his room this afternoon.  Stated he feels tired and wants to rest today.

## 2014-11-17 NOTE — [facility] Suicide Risk Assessment (Signed)
[facility] Admission Suicide Risk Assessment   Nursing information obtained from:    Demographic factors:    Current Mental Status:    Loss Factors:    Historical Factors:    Risk Reduction Factors:    Total Time spent with patient: 45 minutes Principal Problem: MDD (major depressive disorder), single episode, severe with psychotic features Diagnosis:   Patient Active Problem List   Diagnosis Date Noted  . MDD (major depressive disorder), single episode, severe with psychotic features [F32.3] 11/17/2014  . Polysubstance abuse [F19.10] 11/17/2014  . Substance induced mood disorder [F19.94] 11/17/2014     Continued Clinical Symptoms:  Alcohol Use Disorder Identification Test Final Score (AUDIT): 28 The "Alcohol Use Disorders Identification Test", Guidelines for Use in Primary Care, Second Edition.  World Science writer [facility]). Score between 0-7:  no or low risk or alcohol related problems. Score between 8-15:  moderate risk of alcohol related problems. Score between 16-19:  high risk of alcohol related problems. Score 20 or above:  warrants further diagnostic evaluation for alcohol dependence and treatment.   CLINICAL FACTORS:   Severe Anxiety and/or Agitation Depression:   Anhedonia Comorbid alcohol abuse/dependence Impulsivity Insomnia Recent sense of peace/wellbeing Severe Alcohol/Substance Abuse/Dependencies Unstable or Poor Therapeutic Relationship   Musculoskeletal: Strength & Muscle Tone: within normal limits Gait & Station: normal Patient leans: N/A  Psychiatric Specialty Exam: Physical Exam Full physical performed in Emergency Department. I have reviewed this assessment and concur with its findings.   ROS  No Fever-chills, No Headache, No changes with Vision or hearing, reports vertigo No problems swallowing food or Liquids, No Chest pain, Cough or Shortness of Breath, No Abdominal pain, No Nausea or Vommitting, Bowel movements are regular, No Blood in stool  or Urine, No dysuria, No new skin rashes or bruises, No new joints pains-aches,  No new weakness, tingling, numbness in any extremity, No recent weight gain or loss, No polyuria, polydypsia or polyphagia,   A full 10 point Review of Systems was done, except as stated above, all other Review of Systems were negative.  Blood pressure 115/84, pulse 88, temperature 98.4 F (36.9 C), temperature source Oral, resp. rate 18, height  (1.753 m), weight 63.957 kg (141 lb).Body mass index is 20.81 kg/(m^2).  General Appearance: Disheveled  Eye Contact::  Good  Speech:  Clear and Coherent  Volume:  Decreased  Mood:  Anxious and Depressed  Affect:  Constricted and Depressed  Thought Process:  Coherent and Goal Directed  Orientation:  Full (Time, Place, and Person)  Thought Content:  WDL  Suicidal Thoughts:  No  Homicidal Thoughts:  No  Memory:  Immediate;   Fair Recent;   Fair  Judgement:  Intact  Insight:  Fair  Psychomotor Activity:  Decreased  Concentration:  Fair  Recall:  Good  Fund of Knowledge:Good  Language: Good  Akathisia:  Negative  Handed:  Right  AIMS (if indicated):     Assets:  Communication Skills Desire for Improvement Financial Resources/Insurance Leisure Time Resilience Social Support Talents/Skills  Sleep:  Number of Hours: 6.75  Cognition: WNL  ADL's:  Intact     COGNITIVE FEATURES THAT CONTRIBUTE TO RISK:  Loss of executive function and Polarized thinking    SUICIDE RISK:   Mild:  Suicidal ideation of limited frequency, intensity, duration, and specificity.  There are no identifiable plans, no associated intent, mild dysphoria and related symptoms, good self-control (both objective and subjective assessment), few other risk factors, and identifiable protective factors, including available and  accessible social support.  PLAN OF CARE: Admit for substance induced mood disorder, polysubstance abuse and crisis evaluation, safety monitoring and  medication management.  Medical Decision Making:  Review of Psycho-Social Stressors (1), Review or order clinical lab tests (1), Established Problem, Worsening (2), Review of Last Therapy Session (1), Review or order medicine tests (1), Review of Medication Regimen & Side Effects (2) and Review of New Medication or Change in Dosage (2)  I certify that inpatient services furnished can reasonably be expected to improve the patient's condition.   JONNALAGADDA,JANARDHAHA R. 11/17/2014, 2:45 PM

## 2014-11-17 NOTE — Progress Notes (Signed)
DAR Note: Patient is alert, calm, and cooperative.  Denies pain, auditory and visual hallucinations.  Rates depression and anxiety at 7/10.  Denies thoughts to harm himself or others.  Patient very appreciative of being here.  Maintained on routine safety checks.  Vistaril given for anxiety.  Support and encouragement offered as needed.

## 2014-11-17 NOTE — Progress Notes (Signed)
D    Pt is anxious and sad he denies hallucinations and denies suicidal ideation  He reports feeling a lot better and said his rash has improved and the medication is helping him not have withdrawal symptoms  A   Verbal support given   Medications administered and effectiveness monitored   Q 15 min checks  R   Pt is safe at present

## 2014-11-17 NOTE — [facility] Group Notes (Signed)
[facility] LCSW Group Therapy  11/17/2014  1:15 PM  Type of Therapy:  Group Therapy  Participation Level:  Did Not Attend although encouraged by MHT  Summary of Progress/Problems: The main focus of today's process group was for the patient to identify ways in which they have in the past sabotaged their own recovery. Motivational Interviewing was utilized to encourage patient's to explore what they wish to change. The concept of HALT (Am I hungry, angry, lonely or tired?) was introduced as a means of establishing a habit of self care.     Catherine C Harrill, LCSW    

## 2014-11-17 NOTE — Progress Notes (Signed)
[facility] Group Notes:  (Nursing/MHT/Case Management/Adjunct)  Date:  11/17/2014  Time:  10:04 PM  Type of Therapy:  Psychoeducational Skills  Participation Level:  Active  Participation Quality:  Appropriate  Affect:  Flat  Cognitive:  Appropriate  Insight:  Good  Engagement in Group:  Engaged  Modes of Intervention:  Education  Summary of Progress/Problems: The patient indicated in group that he slept a great deal today. He also shared that he is attempting to "straighten out" his life. He is focusing on his recovery "one day at a time" and is seeking some form of long-term treatment for his addiction. The patient states that his motivation for sobriety is his daughter. As a theme for the day, his coping skill will be to attend a long-term treatment program.   Westly Pam 11/17/2014, 10:04 PM
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
## 2014-11-18 DIAGNOSIS — F333 Major depressive disorder, recurrent, severe with psychotic symptoms: Secondary | ICD-10-CM | POA: Diagnosis present

## 2014-11-18 MED ORDER — BENZOCAINE 10 % MT GEL
Freq: Four times a day (QID) | OROMUCOSAL | Status: DC | PRN
  Administered 2014-11-18: 19:00:00 via OROMUCOSAL
  Filled 2014-11-18: qty 9.4

## 2014-11-18 MED ORDER — IBUPROFEN 600 MG PO TABS
600.0000 mg | ORAL_TABLET | Freq: Four times a day (QID) | ORAL | Status: DC | PRN
  Administered 2014-11-18 – 2014-11-20 (×4): 600 mg via ORAL
  Filled 2014-11-18 (×4): qty 1

## 2014-11-18 MED ORDER — ENSURE ENLIVE PO LIQD
237.0000 mL | Freq: Two times a day (BID) | ORAL | Status: DC
  Administered 2014-11-18 – 2014-11-21 (×6): 237 mL via ORAL

## 2014-11-18 MED ORDER — IBUPROFEN 600 MG PO TABS
ORAL_TABLET | ORAL | Status: AC
  Filled 2014-11-18: qty 1

## 2014-11-18 NOTE — Progress Notes (Signed)
[facility] Group Notes:  (Nursing/MHT/Case Management/Adjunct)  Date:  11/18/2014  Time:  11:32 AM  Type of Therapy:  Group Therapy  Participation Level:  Did Not Attend  Participation Quality:  N/A  Affect:  N/A  Cognitive:  N/A  Insight:  None  Engagement in Group:  N/A  Modes of Intervention:  Patient stated that he felt sick from flu shot and that he would try to make it to group tomorrow.   Summary of Progress/Problems:  Laren Boom 11/18/2014, 11:32 AM

## 2014-11-18 NOTE — [facility] Group Notes (Signed)
[facility] LCSW Group Therapy  11/18/2014 11:00 AM   Type of Therapy:  Group Therapy  Participation Level:  Did Not Attend  Chelsea Horton, LCSW 11/18/2014 1:08 PM    

## 2014-11-18 NOTE — Progress Notes (Signed)
Adult Psychoeducational Group Note  Date:  11/18/2014 Time:  8:34 PM  Group Topic/Focus:  Wrap-Up Group:   The focus of this group is to help patients review their daily goal of treatment and discuss progress on daily workbooks.  Participation Level:  Active  Participation Quality:  Appropriate  Affect:  Appropriate  Cognitive:  Appropriate  Insight: Appropriate  Engagement in Group:  Engaged  Modes of Intervention:  Discussion  Additional Comments: The patient expressed that he rates his day a 9-10.The patient also said that his day was great.  Octavio Manns 11/18/2014, 8:34 PM
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
## 2014-11-19 DIAGNOSIS — F1320 Sedative, hypnotic or anxiolytic dependence, uncomplicated: Secondary | ICD-10-CM | POA: Clinically undetermined

## 2014-11-19 DIAGNOSIS — F1599 Other stimulant use, unspecified with unspecified stimulant-induced disorder: Secondary | ICD-10-CM

## 2014-11-19 DIAGNOSIS — Z720 Tobacco use: Secondary | ICD-10-CM

## 2014-11-19 DIAGNOSIS — F1110 Opioid abuse, uncomplicated: Secondary | ICD-10-CM

## 2014-11-19 DIAGNOSIS — F1220 Cannabis dependence, uncomplicated: Secondary | ICD-10-CM | POA: Clinically undetermined

## 2014-11-19 DIAGNOSIS — F1020 Alcohol dependence, uncomplicated: Secondary | ICD-10-CM

## 2014-11-19 DIAGNOSIS — Z8619 Personal history of other infectious and parasitic diseases: Secondary | ICD-10-CM | POA: Diagnosis present

## 2014-11-19 DIAGNOSIS — F1590 Other stimulant use, unspecified, uncomplicated: Secondary | ICD-10-CM | POA: Clinically undetermined

## 2014-11-19 DIAGNOSIS — F17200 Nicotine dependence, unspecified, uncomplicated: Secondary | ICD-10-CM | POA: Diagnosis present

## 2014-11-19 DIAGNOSIS — F333 Major depressive disorder, recurrent, severe with psychotic symptoms: Principal | ICD-10-CM

## 2014-11-19 DIAGNOSIS — F1390 Sedative, hypnotic, or anxiolytic use, unspecified, uncomplicated: Secondary | ICD-10-CM

## 2014-11-19 MED ORDER — TRAZODONE HCL 50 MG PO TABS: 50.0000 mg | ORAL_TABLET | Freq: Every day | ORAL | Status: DC

## 2014-11-19 MED ORDER — TRAZODONE HCL 100 MG PO TABS
100.0000 mg | ORAL_TABLET | Freq: Every day | ORAL | Status: DC
  Administered 2014-11-19 – 2014-11-20 (×2): 100 mg via ORAL
  Filled 2014-11-19: qty 14
  Filled 2014-11-19 (×3): qty 1

## 2014-11-19 MED ORDER — ESCITALOPRAM OXALATE 10 MG PO TABS
10.0000 mg | ORAL_TABLET | Freq: Every day | ORAL | Status: DC
  Administered 2014-11-20 – 2014-11-21 (×2): 10 mg via ORAL
  Filled 2014-11-19: qty 1
  Filled 2014-11-19: qty 14
  Filled 2014-11-19 (×2): qty 1

## 2014-11-19 MED ORDER — CHLORDIAZEPOXIDE HCL 25 MG PO CAPS
25.0000 mg | ORAL_CAPSULE | Freq: Four times a day (QID) | ORAL | Status: DC | PRN
Start: 2014-11-19 — End: 2014-11-21

## 2014-11-19 MED ORDER — RISPERIDONE 0.25 MG PO TABS
0.2500 mg | ORAL_TABLET | Freq: Every day | ORAL | Status: DC
  Administered 2014-11-19 – 2014-11-20 (×2): 0.25 mg via ORAL
  Filled 2014-11-19: qty 14
  Filled 2014-11-19 (×3): qty 1
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
## 2014-11-19 NOTE — Progress Notes (Signed)
[facility] MD Progress Note   Maxwell [name]  MRN:  [id number]   Subjective:  Pt states: "I feel so much better. The medication is helping. My sleep is good. I'm not feeling suicidal anymore or hearing any voices."  Objective: Pt seen and chart reviewed. Pt has improved dramatically in regard to his presentation in that he is more reactive, does not present as depressed, and appears to be less anxious. Nursing staff report that they have also seen excellent improvement in affect. He denies suicidal/homicidal ideation and psychosis and does not appear to be responding to internal stimuli. He reports that he "slept great" last night with the medication and that his appetite has returned. Pt also denies any withdrawal symptoms and is very optimistic about his treatment. Pt does have a mild toothache for which we ordered meds below.   Principal Problem: MDD (major depressive disorder), recurrent, severe, with psychosis Diagnosis:   Patient Active Problem List   Diagnosis Date Noted  . MDD (major depressive disorder), recurrent, severe, with psychosis [F33.3] 11/18/2014    Priority: High  . Polysubstance abuse [F19.10] 11/17/2014    Priority: High  . Substance induced mood disorder [F19.94] 11/17/2014    Priority: High   Total Time spent with patient: 15 minutes   Past Medical History:  Past Medical History  Diagnosis Date  . Polysubstance abuse   . H/O suicide attempt     cut wrists at age 28   History reviewed. No pertinent past surgical history. Family History: History reviewed. No pertinent family history. Social History:  History  Alcohol Use  . Yes    Comment: occasionally     History  Drug Use No    Comment: lastr use two weeks prior    Social History   Social History  . Marital Status: Single    Spouse Name: N/A  . Number of Children: N/A  . Years of Education: N/A   Social History Main Topics  . Smoking status: Current Every Day Smoker    Types: Cigarettes  .  Smokeless tobacco: None  . Alcohol Use: Yes     Comment: occasionally  . Drug Use: No     Comment: lastr use two weeks prior  . Sexual Activity: Not Asked   Other Topics Concern  . None   Social History Narrative   Additional History:    Sleep: Good  Appetite:  Good   Assessment: See above  Musculoskeletal: Strength & Muscle Tone: within normal limits Gait & Station: normal Patient leans: N/A   Psychiatric Specialty Exam: Physical Exam  Review of Systems  Psychiatric/Behavioral: Positive for depression and substance abuse. Negative for hallucinations. The patient is nervous/anxious. The patient does not have insomnia.   All other systems reviewed and are negative.   Blood pressure 132/90, pulse 95, temperature 98.7 F (37.1 C), temperature source Oral, resp. rate 18, height  (1.753 m), weight 63.957 kg (141 lb), SpO2 100 %.Body mass index is 20.81 kg/(m^2).  General Appearance: Casual and Fairly Groomed  Eye Contact::  Good  Speech:  Clear and Coherent and Normal Rate  Volume:  Normal  Mood:  Depressed  Affect:  Appropriate and Depressed  Thought Process:  Coherent, Goal Directed, Intact, Linear and Logical  Orientation:  Full (Time, Place, and Person)  Thought Content:  WDL  Suicidal Thoughts:  No  Homicidal Thoughts:  No  Memory:  Immediate;   Fair Recent;   Fair Remote;   Fair  Judgement:  Fair  Insight:  Fair  Psychomotor Activity:  Normal  Concentration:  Good  Recall:  Good  Fund of Knowledge:Good  Language: Fair  Akathisia:  No  Handed:    AIMS (if indicated):     Assets:  Communication Skills Desire for Improvement Resilience Social Support  ADL's:  Intact  Cognition: WNL  Sleep:  Number of Hours: 6.75     Current Medications: Current Facility-Administered Medications  Medication Dose Route Frequency Provider Last Rate Last Dose  . acetaminophen (TYLENOL) tablet 650 mg  650 mg Oral Q6H PRN Worthy Flank, NP      . alum & mag  hydroxide-simeth (MAALOX/MYLANTA) 200-200-20 MG/5ML suspension 30 mL  30 mL Oral Q4H PRN Worthy Flank, NP      . benzocaine (ORAJEL) 10 % mucosal gel   Mouth/Throat QID PRN Beau Fanny, FNP      . escitalopram (LEXAPRO) tablet 5 mg  5 mg Oral Daily Beau Fanny, FNP   5 mg at 11/19/14 0805  . feeding supplement (ENSURE ENLIVE) (ENSURE ENLIVE) liquid 237 mL  237 mL Oral BID BM Constance Haw, RN   237 mL at 11/19/14 0807  . hydrocortisone 1 % ointment   Topical TID Beau Fanny, FNP      . hydrOXYzine (ATARAX/VISTARIL) tablet 25 mg  25 mg Oral TID PRN Worthy Flank, NP   25 mg at 11/18/14 2024  . ibuprofen (ADVIL,MOTRIN) tablet 600 mg  600 mg Oral Q6H PRN Beau Fanny, FNP   600 mg at 11/18/14 2023  . loperamide (IMODIUM) capsule 2-4 mg  2-4 mg Oral PRN Beau Fanny, FNP      . LORazepam (ATIVAN) tablet 1 mg  1 mg Oral Q6H PRN Beau Fanny, FNP   1 mg at 11/17/14 2050  . magnesium hydroxide (MILK OF MAGNESIA) suspension 30 mL  30 mL Oral Daily PRN Worthy Flank, NP      . multivitamin with minerals tablet 1 tablet  1 tablet Oral Daily Beau Fanny, FNP   1 tablet at 11/19/14 0805  . nicotine (NICODERM CQ - dosed in mg/24 hours) patch 21 mg  21 mg Transdermal Daily Jomarie Longs, MD   21 mg at 11/19/14 0805  . ondansetron (ZOFRAN-ODT) disintegrating tablet 4 mg  4 mg Oral Q6H PRN Beau Fanny, FNP      . thiamine (B-1) injection 100 mg  100 mg Intramuscular Once Beau Fanny, FNP   100 mg at 11/17/14 1530  . thiamine (VITAMIN B-1) tablet 100 mg  100 mg Oral Daily Beau Fanny, FNP   100 mg at 11/19/14 0804  . traZODone (DESYREL) tablet 50 mg  50 mg Oral QHS PRN Beau Fanny, FNP   50 mg at 11/18/14 2024    Lab Results: No results found for this or any previous visit (from the past 48 hour(s)).  Physical Findings: AIMS: Facial and Oral Movements Muscles of Facial Expression: None, normal Lips and Perioral Area: None, normal Jaw: None, normal Tongue: None,  normal,Extremity Movements Upper (arms, wrists, hands, fingers): None, normal Lower (legs, knees, ankles, toes): None, normal, Trunk Movements Neck, shoulders, hips: None, normal, Overall Severity Severity of abnormal movements (highest score from questions above): None, normal Incapacitation due to abnormal movements: None, normal Patient's awareness of abnormal movements (rate only patient's report): No Awareness, Dental Status Current problems with teeth and/or dentures?: No Does patient usually wear dentures?: No  CIWA:  CIWA-Ar Total: 0  COWS:  COWS Total Score: 1  Treatment Plan Summary: Daily contact with patient to assess and evaluate symptoms and progress in treatment and Medication management   Medications:  -Continue Lexapro  daily for anxiety/depression -Continue  CIWA protocol for PRN's only -Continue Hydrocortisone 1% cream for mild rash to trunk -Trazodone  qhs prn insomnia -ADD Orajel and Ibuprofen for mild toothache  Medical Decision Making:  Established Problem, Stable/Improving (1), Review of Psycho-Social Stressors (1), Review or order clinical lab tests (1), Review of Medication Regimen & Side Effects (2) and Review of New Medication or Change in Dosage (2)   Withrow, Everardo All, FNP-BC 09:25/16   8:28 AM  Reviewed the information documented and agree with the treatment plan.  JONNALAGADDA,JANARDHAHA R. 11/20/2014 10:11 AM

## 2014-11-19 NOTE — Tx Team (Signed)
Interdisciplinary Treatment Plan Update (Adult)  Date:  11/19/2014   Time Reviewed:  10:24 AM   Progress in Treatment: Attending groups: Yes. Participating in groups:  Yes. Taking medication as prescribed:  Yes. Tolerating medication:  Yes. Family/Significant other contact made:  No Patient understands diagnosis:  Yes  As evidenced by seeking help with detox and getting into further treatment Discussing patient identified problems/goals with staff:  Yes, see initial care plan. Medical problems stabilized or resolved:  Yes. Denies suicidal/homicidal ideation: Yes. Issues/concerns per patient self-inventory:  No. Other:  New problem(s) identified:  Discharge Plan or Barriers:  See below  Reason for Continuation of Hospitalization: Medication stabilization Withdrawal symptoms  Comments:  Multiple attempts made by this writer and OD [employer] officer to get pt to triage.  Pt making statements in reference to people coming to get him. Pt asked [employer] OD officer, "man, just please take your gun and shoot me. I can't deal with this." When asked what he was talking about pt states, "I hear people talking about me." Pt not able to say who said people are.  Pt continuously stating, "man, just don't let me die. I'm not safe."  Estimated length of stay: 2-3 days  New goal(s):  Review of initial/current patient goals per problem list:   Review of initial/current patient goals per problem list:  1. Goal(s): Patient will participate in aftercare plan   Met: No Target date: 3-5 days post admission date   As evidenced by: Patient will participate within aftercare plan AEB aftercare provider and housing plan at discharge being identified.  11/19/2014: Hopes to get into rehab from here.  Signed a release for [facility].   2. Patient will demonstrate decreased signs and symptoms of anxiety.   Met: Yes Target date: 3-5 days post admission date   As evidenced by: Patient will utilize self  rating of anxiety at 3 or below and demonstrated decreased signs of anxiety, or be deemed stable for discharge by MD 11/19/14  Any anxiety is directly related to detox.   Goal(s): Patient will demonstrate decreased signs of withdrawal due to substance abuse   Met: No Target date: 3-5 days post admission date   As evidenced by: Patient will produce a CIWA/COWS score of 0, have stable vitals signs, and no symptoms of withdrawal 11/19/14:  CIWA score of 7 today.   Goal(s): Patient will demonstrate decreased signs of psychosis  * Met: No  Target date: 3-5 days post admission date  * As evidenced by: Patient will demonstrate decreased frequency of AVH or return to baseline function 11/19/14   Pt c/o psychosis prior to admission, and upon the first day days of admission as well.  Willing to take meds.  Goal progressing    Attendees: Patient:  11/19/2014 10:24 AM   Family:   11/19/2014 10:24 AM   Physician:  Ursula Alert, MD 11/19/2014 10:24 AM   Nursing:   Gaylan Gerold, RN 11/19/2014 10:24 AM   CSW:    Roque Lias, LCSW   11/19/2014 10:24 AM   Other:  11/19/2014 10:24 AM   Other:   11/19/2014 10:24 AM   Other:  Lars Pinks, Nurse CM 11/19/2014 10:24 AM   Other:  Lucinda Dell, [facility] 11/19/2014 10:24 AM   Other:  Norberto Sorenson, [place]  11/19/2014 10:24 AM   Other:  11/19/2014 10:24 AM   Other:  11/19/2014 10:24 AM   Other:  11/19/2014 10:24 AM   Other:  11/19/2014 10:24 AM   Other:  11/19/2014 10:24 AM   Other:   11/19/2014 10:24 AM    Scribe for Treatment Team:   Trish Mage, 11/19/2014 10:24 AM

## 2014-11-19 NOTE — Progress Notes (Signed)
D: Pt has been appropriate, calm and compliant with treatment today on unit. He did not attend a.m groups but is presently in LCSW group. He denies SI/HI/AVH and does not appear to be responding to internal stimuli. Rash noted on back, abdomen, buttocks, and possibly legs. Pt reports it itches. He feels the rash stems from "sleeping on my buddy's floor." A: Meds given as ordered, including hydrocortisone for rash. Q15 safety checks maintained. Support/encouragement offered. R: Pt remains free from harm and continues with treatment. Will continue to monitor for needs/safety.
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
## 2014-11-19 NOTE — Progress Notes (Signed)
[facility] MD Progress Note  11/19/2014 2:08 PM Maxwell [name]  MRN:  [id number] Subjective:  Patient states " I feel dizzy , I just want to take a nap. I was feeling depressed . My sleep is also disrupted ."  Objective:Patient seen and chart reviewed.Discussed patient with treatment team. Maxwell [name] is an 39 y.o. male. Presented to ED earlier on 11-13-14 reporting that people were out to get him, saying he could hear people talking about him and asking [employer] to shoot him.  Pt today continues to be paranoid . Pt also endorses depression, as well as anxiety sx. Pt has been abusing several substances on a regular basis- Amphetamines , cannabis , alcohol , BZD being his primary , he also abuses cocaine, pain medications off and on. Pt currently reports some nausea /tremors as well as anxiety sx. Pt also with sleep issues possibly 2/2 his substance abuse. Discussed Librium to help him through his withdrawal sx.  Pt is interested in referral to a substance rehab program- CSW will start the process. Will continue to monitor.     Principal Problem: MDD (major depressive disorder), recurrent, severe, with psychosis Diagnosis:   Patient Active Problem List   Diagnosis Date Noted  . 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 Cannabis use disorder, severe, dependence [F12.20] 11/19/2014  . 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 Alcohol use disorder, severe, dependence [F10.20] 11/19/2014  . Tobacco use disorder [Z72.0] 11/19/2014  . Opioid use disorder, mild, abuse [F11.10] 11/19/2014  . Hx of gonorrhea [Z86.19] 11/19/2014  . MDD (major depressive disorder), recurrent, severe, with psychosis [F33.3] 11/18/2014   Total Time spent with patient: 30 minutes   Past Medical History:  Past Medical History  Diagnosis Date  . Polysubstance abuse   . H/O suicide attempt     cut wrists at age 20    Family History:Pt did not express any family hx.   Social History:  History   Alcohol Use  . Yes    Comment: occasionally     History  Drug Use No    Comment: lastr use two weeks prior    Social History   Social History  . Marital Status: Single    Spouse Name: N/A  . Number of Children: N/A  . Years of Education: N/A   Social History Main Topics  . Smoking status: Current Every Day Smoker    Types: Cigarettes  . Smokeless tobacco: None  . Alcohol Use: Yes     Comment: occasionally  . Drug Use: No     Comment: lastr use two weeks prior  . Sexual Activity: Not Asked   Other Topics Concern  . None   Social History Narrative   Additional History:    Sleep: Poor  Appetite:  Poor   Musculoskeletal: Strength & Muscle Tone: within normal limits Gait & Station: normal Patient leans: N/A   Psychiatric Specialty Exam: Physical Exam  Skin:  Rash on abdomen    Review of Systems  Neurological: Positive for dizziness and tremors.  Psychiatric/Behavioral: Positive for depression and substance abuse. Negative for suicidal ideas and hallucinations. The patient is nervous/anxious and has insomnia.   All other systems reviewed and are negative.   Blood pressure 117/73, pulse 75, temperature 97.9 F (36.6 C), temperature source Oral, resp. rate 18, height  (1.753 m), weight 63.957 kg (141 lb), SpO2 100 %.Body mass index is 20.81 kg/(m^2).  General Appearance: Disheveled  Eye Contact::  Poor  Speech:  Slow  Volume:  Decreased  Mood:  Anxious and Depressed  Affect:  Constricted  Thought Process:  Goal Directed  Orientation:  Full (Time, Place, and Person)  Thought Content:  Paranoid Ideation  Suicidal Thoughts:  No  Homicidal Thoughts:  No  Memory:  Immediate;   Fair Recent;   Fair Remote;   Fair  Judgement:  Poor  Insight:  Shallow  Psychomotor Activity:  Restlessness and Tremor  Concentration:  Poor  Recall:  [employer] of Knowledge:Fair  Language: Fair  Akathisia:  No    AIMS (if indicated):     Assets:  Others:  access to  health care  ADL's:  Intact  Cognition: WNL  Sleep:  Number of Hours: 6.75     Current Medications: Current Facility-Administered Medications  Medication Dose Route Frequency Provider Last Rate Last Dose  . acetaminophen (TYLENOL) tablet 650 mg  650 mg Oral Q6H PRN Worthy Flank, NP      . alum & mag hydroxide-simeth (MAALOX/MYLANTA) 200-200-20 MG/5ML suspension 30 mL  30 mL Oral Q4H PRN Worthy Flank, NP      . benzocaine (ORAJEL) 10 % mucosal gel   Mouth/Throat QID PRN Beau Fanny, FNP      . chlordiazePOXIDE (LIBRIUM) capsule 25 mg  25 mg Oral Q6H PRN Jomarie Longs, MD      . Melene Muller ON 11/20/2014] escitalopram (LEXAPRO) tablet 10 mg  10 mg Oral Daily Oluwatomisin Deman, MD      . feeding supplement (ENSURE ENLIVE) (ENSURE ENLIVE) liquid 237 mL  237 mL Oral BID BM Constance Haw, RN   237 mL at 11/19/14 1258  . hydrocortisone 1 % ointment   Topical TID Beau Fanny, FNP      . hydrOXYzine (ATARAX/VISTARIL) tablet 25 mg  25 mg Oral TID PRN Worthy Flank, NP   25 mg at 11/18/14 2024  . ibuprofen (ADVIL,MOTRIN) tablet 600 mg  600 mg Oral Q6H PRN Beau Fanny, FNP   600 mg at 11/18/14 2023  . loperamide (IMODIUM) capsule 2-4 mg  2-4 mg Oral PRN Beau Fanny, FNP      . magnesium hydroxide (MILK OF MAGNESIA) suspension 30 mL  30 mL Oral Daily PRN Worthy Flank, NP      . multivitamin with minerals tablet 1 tablet  1 tablet Oral Daily Beau Fanny, FNP   1 tablet at 11/19/14 0805  . nicotine (NICODERM CQ - dosed in mg/24 hours) patch 21 mg  21 mg Transdermal Daily Jomarie Longs, MD   21 mg at 11/19/14 0805  . ondansetron (ZOFRAN-ODT) disintegrating tablet 4 mg  4 mg Oral Q6H PRN Beau Fanny, FNP      . risperiDONE (RISPERDAL) tablet 0.25 mg  0.25 mg Oral QHS Kolbie Lepkowski, MD      . thiamine (B-1) injection 100 mg  100 mg Intramuscular Once Beau Fanny, FNP   100 mg at 11/17/14 1530  . thiamine (VITAMIN B-1) tablet 100 mg  100 mg Oral Daily Beau Fanny, FNP   100 mg at  11/19/14 0804  . traZODone (DESYREL) tablet 50 mg  50 mg Oral QHS Jomarie Longs, MD        Lab Results: No results found for this or any previous visit (from the past 48 hour(s)).  Physical Findings: AIMS: Facial and Oral Movements Muscles of Facial Expression: None, normal Lips and Perioral Area: None, normal Jaw: None, normal Tongue: None, normal,Extremity Movements Upper (  arms, wrists, hands, fingers): None, normal Lower (legs, knees, ankles, toes): None, normal, Trunk Movements Neck, shoulders, hips: None, normal, Overall Severity Severity of abnormal movements (highest score from questions above): None, normal Incapacitation due to abnormal movements: None, normal Patient's awareness of abnormal movements (rate only patient's report): No Awareness, Dental Status Current problems with teeth and/or dentures?: No Does patient usually wear dentures?: No  CIWA:  CIWA-Ar Total: 4 COWS:  COWS Total Score: 1   Assessment: Patient presented with polysubstance abuse as well as anxiety/depression and paranoia. Pt continues to be paranoid and has sleep issues. Will continue treatment.     Treatment Plan Summary: Daily contact with patient to assess and evaluate symptoms and progress in treatment and Medication management Reviewed past medical records,treatment plan.  Will start CIWA Q4H , Librium 25 mg po q6h prn for anxiety,withdrawal sx. Will increase Lexapro to 10 mg po daily for affective sx. Will increase Trazodone to 100 mg po qhs for sleep. Add Risperidone 0.25 mg po qhs for paranoia. Will continue to monitor vitals ,medication compliance and treatment side effects while patient is here.  Will monitor for medical issues as well as call consult as needed.  Reviewed labs ,will order EKG since pt is being started on Risperidone. Pt was recently treated for Gonorrhea, RPR/HIV-Negative, Chlamydia -Neg. CSW will start working on disposition. Referral to substance abuse  programs. Patient to participate in therapeutic milieu .       Medical Decision Making:  Review of Psycho-Social Stressors (1), Review or order clinical lab tests (1), Review of Last Therapy Session (1), Review of Medication Regimen & Side Effects (2) and Review of New Medication or Change in Dosage (2)     Jamar Casagrande MD 11/19/2014, 2:08 PM

## 2014-11-19 NOTE — Progress Notes (Signed)
D. Pt had been up and visible in milieu this evening, did attend and participate in evening group activity. Pt spoke about his day and spoke about how he wasn't physically feeling well. Pt spoke about how he was having various body aches and just did not feel well in general and attributes it to a flu vaccine he received earlier. Pt also complained of a tooth ache as well as continued itching from a rash on his abdomen. Pt reported that while it is itching and bothering him that it is not as bad as when he was first admitted. Pt received all medications this evening without incident. A. Support and encouragement provided. R. Safety maintained, will continue to monitor.

## 2014-11-20 MED ORDER — MEGESTROL ACETATE 40 MG/ML PO SUSP
400.0000 mg | Freq: Every day | ORAL | Status: DC
  Administered 2014-11-20 – 2014-11-21 (×2): 400 mg via ORAL
  Filled 2014-11-20: qty 10
  Filled 2014-11-20: qty 140
  Filled 2014-11-20 (×2): qty 10

## 2014-11-20 NOTE — Progress Notes (Signed)
Patient ID: Maxwell [name], male   DOB: 06/08/1975, 39 y.o.   MRN: [id number] D-Started on Megace to increase his appetite and gave him first dose at 1500. He requested a list of his meds and it was given to him with the reason he was taking each one. Is pleasant and cooperative and uses good manners. Affect flat, states he is sad and often anxious and has been drinking a lot of alcohol. Reports some benefit from Vistaril given prn earlier today, but thinks he needs something stronger. A-Support offered med ed monitored for safety and medications as ordered. R-No complaints at this time. Participating in groups and noted positive peer interactions.

## 2014-11-20 NOTE — Progress Notes (Signed)
Psychoeducational Group Note  Date:  11/20/2014 Time: 0905  Group Topic/Focus:  Recovery Goals:   The focus of this group is to identify appropriate goals for recovery and establish a plan to achieve them.  Participation Level: Did Not Attend  Participation Quality:  Not Applicable  Affect:  Not Applicable  Cognitive:  Not Applicable  Insight:  Not Applicable  Engagement in Group: Not Applicable  Additional Comments:  Pt refused to attend group this morning.  Johnnell Liou E 11/20/2014, 10:01 AM2

## 2014-11-20 NOTE — Progress Notes (Signed)
[facility] MD Progress Note  11/20/2014 2:10 PM Maxwell [name]  MRN:  [id number] Subjective:  Patient states " I feel a bit better today. I still have decreased appetite . My tremors have improved.'  Objective:Patient seen and chart reviewed.Discussed patient with treatment team. Maxwell [name] is an 39 y.o. male. Presented to ED earlier on 11-13-14 reporting that people were out to get him, saying he could hear people talking about him and asking [employer] to shoot him.  Pt today with improvement of his sx. Pt with continued withdrawal sx - but improving - tremors are better than yesterday. He continues to struggle with appetite issues . Encouraged to monitor sx and let writer know if they improve. Could start an appetite stimulant today .His sleep is improved Pt had been abusing several substances on a regular basis- Amphetamines , cannabis , alcohol , BZD being his primary , he also abuses cocaine, pain medications off and on. Pt currently reports some nausea /tremors as well as anxiety sx. Pt continues to be interested in referral to a substance rehab program. Per staff - no disruptive issues noted on the unit. Will continue to monitor.     Principal Problem: MDD (major depressive disorder), recurrent, severe, with psychosis Diagnosis:   Patient Active Problem List   Diagnosis Date Noted  . 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 Cannabis use disorder, severe, dependence [F12.20] 11/19/2014  . 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 Alcohol use disorder, severe, dependence [F10.20] 11/19/2014  . Tobacco use disorder [Z72.0] 11/19/2014  . Opioid use disorder, mild, abuse [F11.10] 11/19/2014  . Hx of gonorrhea [Z86.19] 11/19/2014  . MDD (major depressive disorder), recurrent, severe, with psychosis [F33.3] 11/18/2014   Total Time spent with patient: 25 minutes   Past Medical History:  Past Medical History  Diagnosis Date  . Polysubstance abuse   . H/O suicide attempt      cut wrists at age 16    Family History:Pt did not express any family hx.   Social History:  History  Alcohol Use  . Yes    Comment: occasionally     History  Drug Use No    Comment: lastr use two weeks prior    Social History   Social History  . Marital Status: Single    Spouse Name: N/A  . Number of Children: N/A  . Years of Education: N/A   Social History Main Topics  . Smoking status: Current Every Day Smoker    Types: Cigarettes  . Smokeless tobacco: None  . Alcohol Use: Yes     Comment: occasionally  . Drug Use: No     Comment: lastr use two weeks prior  . Sexual Activity: Not Asked   Other Topics Concern  . None   Social History Narrative   Additional History:    Sleep: Fair  Appetite:  Poor   Musculoskeletal: Strength & Muscle Tone: within normal limits Gait & Station: normal Patient leans: N/A   Psychiatric Specialty Exam: Physical Exam  Skin:  Rash on abdomen    Review of Systems  Neurological: Positive for tremors. Negative for dizziness.  Psychiatric/Behavioral: Positive for depression and substance abuse. Negative for suicidal ideas and hallucinations. The patient is nervous/anxious. The patient does not have insomnia.   All other systems reviewed and are negative.   Blood pressure 115/75, pulse 68, temperature 97.8 F (36.6 C), temperature source Oral, resp. rate 15, height  (1.753 m), weight 63.957 kg (141 lb),  SpO2 100 %.Body mass index is 20.81 kg/(m^2).  General Appearance: Fairly Groomed  Patent attorney::  Fair  Speech:  Normal Rate  Volume:  Decreased  Mood:  Anxious and Depressed improving  Affect:  Constricted but reactive  Thought Process:  Goal Directed  Orientation:  Full (Time, Place, and Person)  Thought Content:  Rumination  Suicidal Thoughts:  No  Homicidal Thoughts:  No  Memory:  Immediate;   Fair Recent;   Fair Remote;   Fair  Judgement:  Poor  Insight:  Shallow  Psychomotor Activity:  Restlessness and  Tremor improving  Concentration:  Fair  Recall:  [employer] of Knowledge:Fair  Language: Fair  Akathisia:  No    AIMS (if indicated):     Assets:  Housing Physical Health Social Support Talents/Skills Others:  access to health care  ADL's:  Intact  Cognition: WNL  Sleep:  Number of Hours: 6.75     Current Medications: Current Facility-Administered Medications  Medication Dose Route Frequency Provider Last Rate Last Dose  . acetaminophen (TYLENOL) tablet 650 mg  650 mg Oral Q6H PRN Worthy Flank, NP   650 mg at 11/19/14 2107  . alum & mag hydroxide-simeth (MAALOX/MYLANTA) 200-200-20 MG/5ML suspension 30 mL  30 mL Oral Q4H PRN Worthy Flank, NP      . benzocaine (ORAJEL) 10 % mucosal gel   Mouth/Throat QID PRN Beau Fanny, FNP      . chlordiazePOXIDE (LIBRIUM) capsule 25 mg  25 mg Oral Q6H PRN Saramma Eappen, MD      . escitalopram (LEXAPRO) tablet 10 mg  10 mg Oral Daily Jomarie Longs, MD   10 mg at 11/20/14 0801  . feeding supplement (ENSURE ENLIVE) (ENSURE ENLIVE) liquid 237 mL  237 mL Oral BID BM Constance Haw, RN   237 mL at 11/20/14 1610  . hydrocortisone 1 % ointment   Topical TID Beau Fanny, FNP      . hydrOXYzine (ATARAX/VISTARIL) tablet 25 mg  25 mg Oral TID PRN Worthy Flank, NP   25 mg at 11/20/14 1257  . ibuprofen (ADVIL,MOTRIN) tablet 600 mg  600 mg Oral Q6H PRN Beau Fanny, FNP   600 mg at 11/20/14 1259  . loperamide (IMODIUM) capsule 2-4 mg  2-4 mg Oral PRN Beau Fanny, FNP      . magnesium hydroxide (MILK OF MAGNESIA) suspension 30 mL  30 mL Oral Daily PRN Worthy Flank, NP      . megestrol (MEGACE) 40 MG/ML suspension 400 mg  400 mg Oral Daily Saramma Eappen, MD      . multivitamin with minerals tablet 1 tablet  1 tablet Oral Daily Beau Fanny, FNP   1 tablet at 11/20/14 0801  . nicotine (NICODERM CQ - dosed in mg/24 hours) patch 21 mg  21 mg Transdermal Daily Jomarie Longs, MD   21 mg at 11/20/14 0802  . ondansetron (ZOFRAN-ODT)  disintegrating tablet 4 mg  4 mg Oral Q6H PRN Beau Fanny, FNP      . risperiDONE (RISPERDAL) tablet 0.25 mg  0.25 mg Oral QHS Jomarie Longs, MD   0.25 mg at 11/19/14 2105  . thiamine (B-1) injection 100 mg  100 mg Intramuscular Once Beau Fanny, FNP   100 mg at 11/17/14 1530  . thiamine (VITAMIN B-1) tablet 100 mg  100 mg Oral Daily Beau Fanny, FNP   100 mg at 11/20/14 0802  . traZODone (DESYREL) tablet 100 mg  100 mg Oral QHS Jomarie Longs, MD   100 mg at 11/19/14 2105    Lab Results: No results found for this or any previous visit (from the past 48 hour(s)).  Physical Findings: AIMS: Facial and Oral Movements Muscles of Facial Expression: None, normal Lips and Perioral Area: None, normal Jaw: None, normal Tongue: None, normal,Extremity Movements Upper (arms, wrists, hands, fingers): None, normal Lower (legs, knees, ankles, toes): None, normal, Trunk Movements Neck, shoulders, hips: None, normal, Overall Severity Severity of abnormal movements (highest score from questions above): None, normal Incapacitation due to abnormal movements: None, normal Patient's awareness of abnormal movements (rate only patient's report): No Awareness, Dental Status Current problems with teeth and/or dentures?: Yes (States that wisdom tooth is coming in.) Does patient usually wear dentures?: No  CIWA:  CIWA-Ar Total: 7 COWS:  COWS Total Score: 1   Assessment: Patient presented with polysubstance abuse as well as anxiety/depression and paranoia. Pt continues to improve , has appetite issues as well as depressive sx . Will continue treatment.     Treatment Plan Summary: Daily contact with patient to assess and evaluate symptoms and progress in treatment and Medication management Reviewed past medical records,treatment plan.  Will continue CIWA Q4H , Librium 25 mg po q6h prn for anxiety,withdrawal sx. Will continue Lexapro 10 mg po daily for affective sx. Will continue Trazodone to 100 mg  po qhs for sleep. Continue Risperidone 0.25 mg po qhs for paranoia. Add Megace 400 mg po daily for appetite issues. Will continue to monitor vitals ,medication compliance and treatment side effects while patient is here.  Will monitor for medical issues as well as call consult as needed.  Reviewed labs ,EKG- wnl - qtc wnl. Pt was recently treated for Gonorrhea, RPR/HIV-Negative, Chlamydia -Neg. CSW will start working on disposition. Referral to substance abuse programs. Patient to participate in therapeutic milieu .       Medical Decision Making:  Review of Psycho-Social Stressors (1), Review or order clinical lab tests (1), Review of Last Therapy Session (1), Review of Medication Regimen & Side Effects (2) and Review of New Medication or Change in Dosage (2)     Eappen,Saramma MD 11/20/2014, 2:10 PM

## 2014-11-20 NOTE — Progress Notes (Signed)
D. Pt had been up and visible in milieu this evening, did attend and participate in evening group activity. Pt spoke about how this is the first time he has ever received any type of treatment and feels that he is ready to help himself and stop using various substances. Pt spoke about possibly going to [facility] with the intentions of helping himself and spoke about hopefully being able to get back in the lives of his two teenage daughters who he says has not seen in the past 7 years. Pt also complained of tooth pain and spoke about how he believes some wisdom teeth are coming in and has had intermittent pain associated with it throughout the day. A. Support provided, medication education given. R. Pt verbalized understanding, safety maintained.

## 2014-11-20 NOTE — [facility] Group Notes (Signed)
[facility] LCSW Group Therapy  11/20/2014 1:15 pm  Type of Therapy: Process Group Therapy  Participation Level:  Active  Participation Quality:  Appropriate  Affect:  Flat  Cognitive:  Oriented  Insight:  Improving  Engagement in Group:  Limited  Engagement in Therapy:  Limited  Modes of Intervention:  Activity, Clarification, Education, Problem-solving and Support  Summary of Progress/Problems: Today's group addressed the issue of overcoming obstacles.  Patients were asked to identify their biggest obstacle post d/c that stands in the way of their on-going success, and then problem solve as to how to manage this.  Maveric was engaged throughout.  Talked about his experience with community here, and how he has found support and encouragement from others.  He last experienced it many years ago when he was with the mother of his children, and they were raising 4 children together.  Talked about his focus of sobriety and how he needs to make that and a community that supports it his priority, and not get caught up in distracting issues.   Ida Rogue 11/20/2014   2:43 PM

## 2014-11-20 NOTE — Progress Notes (Signed)
Nursing Note: 0700-1900  D:  Mood is anxious, pt is cooperative and motivated to participate in milieu. Pt verbalized that he would like to get into a facility to help him with addiction issues. Rates both depression and hopelessness as 0/10 on Self -Inventory sheet. Rates anxiety 5/10.  A:  Vistaril 25 mg PO given for anxiety, CIWA scale at noon was 7.  Noted marked improvement in anxiety after.  Ibuprofen  PO for R (posterior) oral pain "from wisdom tooth."  Encouraged to verbalize needs and concerns, active listening and support provided.  Continued Q 15 minute safety checks.  Observed active participation in SW group.  R:  Pt. denies SI/HI and A/V hallucinations and is able to verbally contract for safety. Pt verbalizes "I am glad to be here, I need this help."

## 2014-11-21 MED ORDER — MEGESTROL ACETATE 40 MG/ML PO SUSP: 400.0000 mg | Freq: Every day | ORAL | Status: DC

## 2014-11-21 MED ORDER — RISPERIDONE 0.25 MG PO TABS: 0.2500 mg | ORAL_TABLET | Freq: Every day | ORAL | Status: DC

## 2014-11-21 MED ORDER — NICOTINE 21 MG/24HR TD PT24: 21.0000 mg | MEDICATED_PATCH | Freq: Every day | TRANSDERMAL | Status: DC

## 2014-11-21 MED ORDER — ESCITALOPRAM OXALATE 10 MG PO TABS: 10.0000 mg | ORAL_TABLET | Freq: Every day | ORAL | Status: DC

## 2014-11-21 MED ORDER — BENZOCAINE 10 % MT GEL: Freq: Four times a day (QID) | OROMUCOSAL | Status: DC | PRN

## 2014-11-21 MED ORDER — HYDROXYZINE HCL 25 MG PO TABS: 25.0000 mg | ORAL_TABLET | Freq: Three times a day (TID) | ORAL | Status: DC | PRN

## 2014-11-21 MED ORDER — TRAZODONE HCL 100 MG PO TABS: 100.0000 mg | ORAL_TABLET | Freq: Every day | ORAL | Status: DC

## 2014-11-21 NOTE — Tx Team (Signed)
Interdisciplinary Treatment Plan Update (Adult)  Date:  11/21/2014   Time Reviewed:  10:58 AM   Progress in Treatment: Attending groups: Yes. Participating in groups:  Yes. Taking medication as prescribed:  Yes. Tolerating medication:  Yes. Family/Significant other contact made:  No Patient understands diagnosis:  Yes  As evidenced by seeking help with detox and getting into further treatment Discussing patient identified problems/goals with staff:  Yes, see initial care plan. Medical problems stabilized or resolved:  Yes. Denies suicidal/homicidal ideation: Yes. Issues/concerns per patient self-inventory:  No. Other:  New problem(s) identified:  Discharge Plan or Barriers:  See below  Reason for Continuation of Hospitalization:   Comments:  Multiple attempts made by this writer and OD [employer] officer to get pt to triage.  Pt making statements in reference to people coming to get him. Pt asked [employer] OD officer, "man, just please take your gun and shoot me. I can't deal with this." When asked what he was talking about pt states, "I hear people talking about me." Pt not able to say who said people are.  Pt continuously stating, "man, just don't let me die. I'm not safe."  Estimated length of stay: D/C today  New goal(s):  Review of initial/current patient goals per problem list:   Review of initial/current patient goals per problem list:  1. Goal(s): Patient will participate in aftercare plan   Met: Yes   Target date: 3-5 days post admission date   As evidenced by: Patient will participate within aftercare plan AEB aftercare provider and housing plan at discharge being identified.  11/19/14: Hopes to get into rehab from here.  Signed a release for [facility]. 11/21/14  Screening for admission appointment tomorrow at [facility]  2. Patient will demonstrate decreased signs and symptoms of anxiety.   Met: Yes Target date: 3-5 days post admission date   As evidenced by: Patient  will utilize self rating of anxiety at 3 or below and demonstrated decreased signs of anxiety, or be deemed stable for discharge by MD 11/19/14  Any anxiety is directly related to detox.   Goal(s): Patient will demonstrate decreased signs of withdrawal due to substance abuse   Met: Yes   Target date: 3-5 days post admission date   As evidenced by: Patient will produce a CIWA/COWS score of 0, have stable vitals signs, and no symptoms of withdrawal 11/19/14:  CIWA score of 7 today. No signs nor symptoms of withdrawal today.  Goal met.  R [place] LCSW 11/21/2014   Goal(s): Patient will demonstrate decreased signs of psychosis  * Met: Yes  Target date: 3-5 days post admission date  * As evidenced by: Patient will demonstrate decreased frequency of AVH or return to baseline function 11/19/14   Pt c/o psychosis prior to admission, and upon the first day days of admission as well.  Willing to take meds.  Goal progressing 11/21/14  No signs nor symptoms of psychosis today   Attendees: Patient:  11/21/2014 10:58 AM   Family:   11/21/2014 10:58 AM   Physician:  Ursula Alert, MD 11/21/2014 10:58 AM   Nursing:   Leonia Reader, RN 11/21/2014 10:58 AM   CSW:    Roque Lias, LCSW   11/21/2014 10:58 AM   Other:  11/21/2014 10:58 AM   Other:   11/21/2014 10:58 AM   Other:  Lars Pinks, Nurse CM 11/21/2014 10:58 AM   Other:  Lucinda Dell, [facility] 11/21/2014 10:58 AM   Other:  Norberto Sorenson, [facility]  11/21/2014 10:58  AM   Other:  11/21/2014 10:58 AM   Other:  11/21/2014 10:58 AM   Other:  11/21/2014 10:58 AM   Other:  11/21/2014 10:58 AM   Other:  11/21/2014 10:58 AM   Other:   11/21/2014 10:58 AM    Scribe for Treatment Team:   Trish Mage, 11/21/2014 10:58 AM

## 2014-11-21 NOTE — Progress Notes (Signed)
  [facility] Adult Case Management Discharge Plan :  Will you be returning to the same living situation after discharge:  Yes,  home for one night-then to [facility] in the AM At discharge, do you have transportation home?: Yes,  bus pass Do you have the ability to pay for your medications: Yes,  mental health  Release of information consent forms completed and in the chart;  Patient's signature needed at discharge.  Patient to Follow up at: Follow-up Information    Follow up with [facility] rehab On 11/22/2014.   Why:  Arrive at [facility] at [employer] via the #1 bus.  Call [facility] to let them know you are there, and they will pick you up by the garden center.   Contact information:   [address] [phone]      Patient denies SI/HI: Yes,  yes    Safety Planning and Suicide Prevention discussed: Yes,  yes  Have you used any form of tobacco in the last 30 days? (Cigarettes, Smokeless Tobacco, Cigars, and/or Pipes): Yes  Has patient been referred to the Quitline?: Yes, faxed on 11/21/14  Maxwell [name] 11/21/2014, 10:55 AM

## 2014-11-21 NOTE — Progress Notes (Signed)
D. Pt had been up and visible in milieu this evening, did attend and participate in evening group activity. Pt spoke of his day, spoke about how he has been feeling better, feels medications are helpful and spoke about how he is to discharge in a day or two but hopes to go to another facility for continuation of his treatment. Pt received all medications without incident this eveing. A. Support and encouragement provided. R. Safety maintained, will continue to monitor.

## 2014-11-21 NOTE — [facility] Suicide Risk Assessment (Signed)
[facility] Discharge Suicide Risk Assessment   Demographic Factors:  Male, Caucasian and Unemployed  Total Time spent with patient: 30 minutes  Musculoskeletal: Strength & Muscle Tone: within normal limits Gait & Station: normal Patient leans: N/A  Psychiatric Specialty Exam: Physical Exam  Review of Systems  Psychiatric/Behavioral: Positive for substance abuse. Negative for depression, suicidal ideas and hallucinations. The patient is not nervous/anxious.   All other systems reviewed and are negative.   Blood pressure 97/65, pulse 73, temperature 98.5 F (36.9 C), temperature source Oral, resp. rate 18, height  (1.753 m), weight 63.957 kg (141 lb), SpO2 100 %.Body mass index is 20.81 kg/(m^2).  General Appearance: Casual  Eye Contact::  Fair  Speech:  Clear and Coherent409  Volume:  Normal  Mood:  Euthymic  Affect:  Appropriate  Thought Process:  Goal Directed  Orientation:  Full (Time, Place, and Person)  Thought Content:  WDL  Suicidal Thoughts:  No  Homicidal Thoughts:  No  Memory:  Immediate;   Fair Recent;   Fair Remote;   Fair  Judgement:  Fair  Insight:  Fair  Psychomotor Activity:  Normal  Concentration:  Fair  Recall:  [employer] of Knowledge:Fair  Language: Fair  Akathisia:  No    AIMS (if indicated):     Assets:  Communication Skills Desire for Improvement  Sleep:  Number of Hours: 6.75  Cognition: WNL  ADL's:  Intact   Have you used any form of tobacco in the last 30 days? (Cigarettes, Smokeless Tobacco, Cigars, and/or Pipes): Yes  Has this patient used any form of tobacco in the last 30 days? (Cigarettes, Smokeless Tobacco, Cigars, and/or Pipes) Yes, Prescription  provided  for nicotine patch.  Mental Status Per Nursing Assessment::   On Admission:     Current Mental Status by Physician: Pt denies SI/HI/AH/VH  Loss Factors: Decrease in vocational status  Historical Factors: Impulsivity  Risk Reduction Factors:   Positive therapeutic  relationship  Continued Clinical Symptoms:  Alcohol/Substance Abuse/Dependencies  Cognitive Features That Contribute To Risk:  None    Suicide Risk:  Minimal: No identifiable suicidal ideation.  Patients presenting with no risk factors but with morbid ruminations; may be classified as minimal risk based on the severity of the depressive symptoms  Principal Problem: MDD (major depressive disorder), recurrent, severe, with psychosis Discharge Diagnoses:  Patient Active Problem List   Diagnosis Date Noted  . 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 Cannabis use disorder, severe, dependence [F12.20] 11/19/2014  . 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 Alcohol use disorder, severe, dependence [F10.20] 11/19/2014  . Tobacco use disorder [Z72.0] 11/19/2014  . Opioid use disorder, mild, abuse [F11.10] 11/19/2014  . Hx of gonorrhea [Z86.19] 11/19/2014  . MDD (major depressive disorder), recurrent, severe, with psychosis [F33.3] 11/18/2014      Plan Of Care/Follow-up recommendations:  Activity:  No restrictions Diet:  regular Tests:  as needed Other:  follow up with after care  Is patient on multiple antipsychotic therapies at discharge:  No   Has Patient had three or more failed trials of antipsychotic monotherapy by history:  No  Recommended Plan for Multiple Antipsychotic Therapies: NA    Eappen,Saramma MD 11/21/2014, 9:23 AM

## 2014-11-21 NOTE — Progress Notes (Signed)
Orders received for patients discharge. Discharge instructions reviewed with the patient at length. He denies any SI/HI and verbalizes understanding of follow up plan. No signs of acute decompensation. Crisis services reviewed. Rx given in addition to 14 day free medication  Supply as ordered. (medications verified by second RN.) All pt belongings returned to patient from storage. Pt escorted from unit to the lobby. Provided bus pass.

## 2014-11-21 NOTE — Progress Notes (Signed)
Pt presents with depressed mood, affect congruent. Patient states he is doing well,''  felt tired this morning and didn't go to breakfast ''  He denies any SI/HI. He states he is hopeful to get to [facility] to [facility]. Denies any physical complaints this am. In no acute distress at this time. Remains on q 15 minute observation. Pt is safe.
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
## 2014-11-21 NOTE — [facility] Suicide Risk Assessment (Signed)
[facility] INPATIENT:  Family/Significant Other Suicide Prevention Education  Suicide Prevention Education:  Patient Refusal for Family/Significant Other Suicide Prevention Education: The patient Maxwell [name] has refused to provide written consent for family/significant other to be provided Family/Significant Other Suicide Prevention Education during admission and/or prior to discharge.  Physician notified.  Daryel Gerald B 11/21/2014, 10:51 AM

## 2014-12-25 ENCOUNTER — Emergency Department (HOSPITAL_COMMUNITY)
Admission: EM | Admit: 2014-12-25 | Discharge: 2014-12-26 | Disposition: A | Discharge status: Other Acute Inpatient Hospital | Payer: Self-pay | Attending: Emergency Medicine | Admitting: Emergency Medicine

## 2014-12-25 ENCOUNTER — Encounter (HOSPITAL_COMMUNITY): Payer: Self-pay

## 2014-12-25 DIAGNOSIS — Z79899 Other long term (current) drug therapy: Secondary | ICD-10-CM | POA: Insufficient documentation

## 2014-12-25 DIAGNOSIS — X58XXXA Exposure to other specified factors, initial encounter: Secondary | ICD-10-CM | POA: Insufficient documentation

## 2014-12-25 DIAGNOSIS — F1310 Sedative, hypnotic or anxiolytic abuse, uncomplicated: Secondary | ICD-10-CM | POA: Insufficient documentation

## 2014-12-25 DIAGNOSIS — S3992XA Unspecified injury of lower back, initial encounter: Secondary | ICD-10-CM | POA: Insufficient documentation

## 2014-12-25 DIAGNOSIS — Y998 Other external cause status: Secondary | ICD-10-CM | POA: Insufficient documentation

## 2014-12-25 DIAGNOSIS — R45851 Suicidal ideations: Secondary | ICD-10-CM | POA: Insufficient documentation

## 2014-12-25 DIAGNOSIS — W19XXXA Unspecified fall, initial encounter: Secondary | ICD-10-CM

## 2014-12-25 DIAGNOSIS — F1910 Other psychoactive substance abuse, uncomplicated: Secondary | ICD-10-CM

## 2014-12-25 DIAGNOSIS — F1210 Cannabis abuse, uncomplicated: Secondary | ICD-10-CM | POA: Insufficient documentation

## 2014-12-25 DIAGNOSIS — Z720 Tobacco use: Secondary | ICD-10-CM | POA: Insufficient documentation

## 2014-12-25 DIAGNOSIS — Y9389 Activity, other specified: Secondary | ICD-10-CM | POA: Insufficient documentation

## 2014-12-25 DIAGNOSIS — Y9289 Other specified places as the place of occurrence of the external cause: Secondary | ICD-10-CM | POA: Insufficient documentation

## 2014-12-25 HISTORY — DX: Schizophrenia, unspecified: F20.9

## 2014-12-25 HISTORY — DX: Depression, unspecified: F32.A

## 2014-12-25 HISTORY — DX: Major depressive disorder, single episode, unspecified: F32.9

## 2014-12-25 LAB — RAPID URINE DRUG SCREEN, HOSP PERFORMED
AMPHETAMINES: NOT DETECTED
BENZODIAZEPINES: POSITIVE — AB
Barbiturates: NOT DETECTED
COCAINE: NOT DETECTED
OPIATES: NOT DETECTED
Tetrahydrocannabinol: POSITIVE — AB

## 2014-12-25 LAB — CBC
HCT: 45.8 % (ref 39.0–52.0)
Hemoglobin: 15.6 g/dL (ref 13.0–17.0)
MCH: 32.7 pg (ref 26.0–34.0)
MCHC: 34.1 g/dL (ref 30.0–36.0)
MCV: 96 fL (ref 78.0–100.0)
PLATELETS: 355 10*3/uL (ref 150–400)
RBC: 4.77 MIL/uL (ref 4.22–5.81)
RDW: 12.8 % (ref 11.5–15.5)
WBC: 8.8 10*3/uL (ref 4.0–10.5)

## 2014-12-25 LAB — COMPREHENSIVE METABOLIC PANEL
ALT: 31 U/L (ref 17–63)
ANION GAP: 11 (ref 5–15)
AST: 37 U/L (ref 15–41)
Albumin: 3.8 g/dL (ref 3.5–5.0)
Alkaline Phosphatase: 97 U/L (ref 38–126)
BUN: 9 mg/dL (ref 6–20)
CALCIUM: 9.2 mg/dL (ref 8.9–10.3)
CHLORIDE: 106 mmol/L (ref 101–111)
CO2: 24 mmol/L (ref 22–32)
Creatinine, Ser: 0.98 mg/dL (ref 0.61–1.24)
GFR calc non Af Amer: >60 mL/min (ref >60)
Glucose, Bld: 75 mg/dL (ref 65–99)
Potassium: 3.3 mmol/L — ABNORMAL LOW (ref 3.5–5.1)
SODIUM: 141 mmol/L (ref 135–145)
Total Bilirubin: 0.5 mg/dL (ref 0.3–1.2)
Total Protein: 7 g/dL (ref 6.5–8.1)

## 2014-12-25 LAB — ETHANOL: Alcohol, Ethyl (B): 237 mg/dL — ABNORMAL HIGH (ref <5)

## 2014-12-25 MED ORDER — ONDANSETRON 4 MG PO TBDP
4.0000 mg | ORAL_TABLET | Freq: Once | ORAL | Status: AC
  Administered 2014-12-25: 4 mg via ORAL

## 2014-12-25 MED ORDER — ONDANSETRON 4 MG PO TBDP
ORAL_TABLET | ORAL | Status: DC
Start: 2014-12-25 — End: 2014-12-26
  Filled 2014-12-25: qty 1
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
## 2014-12-26 ENCOUNTER — Inpatient Hospital Stay (HOSPITAL_COMMUNITY)
Admission: EM | Admit: 2014-12-26 | Discharge: 2014-12-28 | DRG: 897 | Disposition: A | Discharge status: Home / Self Care | Payer: Federal, State, Local not specified - Other | Source: Intra-hospital | Attending: Psychiatry | Admitting: Psychiatry

## 2014-12-26 ENCOUNTER — Emergency Department (HOSPITAL_COMMUNITY): Payer: Self-pay

## 2014-12-26 ENCOUNTER — Encounter (HOSPITAL_COMMUNITY): Payer: Self-pay | Admitting: *Deleted

## 2014-12-26 ENCOUNTER — Encounter (HOSPITAL_COMMUNITY): Payer: Self-pay

## 2014-12-26 DIAGNOSIS — F333 Major depressive disorder, recurrent, severe with psychotic symptoms: Secondary | ICD-10-CM | POA: Diagnosis present

## 2014-12-26 DIAGNOSIS — F1020 Alcohol dependence, uncomplicated: Secondary | ICD-10-CM | POA: Diagnosis not present

## 2014-12-26 DIAGNOSIS — F17210 Nicotine dependence, cigarettes, uncomplicated: Secondary | ICD-10-CM | POA: Diagnosis present

## 2014-12-26 DIAGNOSIS — F1994 Other psychoactive substance use, unspecified with psychoactive substance-induced mood disorder: Secondary | ICD-10-CM | POA: Diagnosis present

## 2014-12-26 DIAGNOSIS — F12288 Cannabis dependence with other cannabis-induced disorder: Secondary | ICD-10-CM | POA: Diagnosis present

## 2014-12-26 DIAGNOSIS — Y907 Blood alcohol level of 200-239 mg/100 ml: Secondary | ICD-10-CM | POA: Diagnosis present

## 2014-12-26 DIAGNOSIS — F1024 Alcohol dependence with alcohol-induced mood disorder: Secondary | ICD-10-CM | POA: Diagnosis present

## 2014-12-26 DIAGNOSIS — F329 Major depressive disorder, single episode, unspecified: Secondary | ICD-10-CM | POA: Diagnosis present

## 2014-12-26 MED ORDER — NICOTINE 21 MG/24HR TD PT24: 21.0000 mg | MEDICATED_PATCH | Freq: Every day | TRANSDERMAL | Status: DC

## 2014-12-26 MED ORDER — VITAMIN B-1 100 MG PO TABS
100.0000 mg | ORAL_TABLET | Freq: Every day | ORAL | Status: DC
Start: 2014-12-27 — End: 2014-12-28
  Administered 2014-12-27 – 2014-12-28 (×2): 100 mg via ORAL
  Filled 2014-12-26 (×5): qty 1

## 2014-12-26 MED ORDER — LORAZEPAM 1 MG PO TABS: 0.0000 mg | ORAL_TABLET | Freq: Two times a day (BID) | ORAL | Status: DC

## 2014-12-26 MED ORDER — MAGNESIUM HYDROXIDE 400 MG/5ML PO SUSP: 30.0000 mL | Freq: Every day | ORAL | Status: DC | PRN

## 2014-12-26 MED ORDER — TRAZODONE HCL 100 MG PO TABS
100.0000 mg | ORAL_TABLET | Freq: Every day | ORAL | Status: DC
  Administered 2014-12-26 – 2014-12-27 (×2): 100 mg via ORAL
  Filled 2014-12-26 (×4): qty 1
  Filled 2014-12-26: qty 7

## 2014-12-26 MED ORDER — NICOTINE 21 MG/24HR TD PT24
21.0000 mg | MEDICATED_PATCH | Freq: Every day | TRANSDERMAL | Status: DC
  Administered 2014-12-26 – 2014-12-28 (×3): 21 mg via TRANSDERMAL
  Filled 2014-12-26 (×5): qty 1

## 2014-12-26 MED ORDER — ONDANSETRON HCL 4 MG PO TABS: 4.0000 mg | ORAL_TABLET | Freq: Three times a day (TID) | ORAL | Status: DC | PRN

## 2014-12-26 MED ORDER — PERMETHRIN 5 % EX CREA
TOPICAL_CREAM | Freq: Once | CUTANEOUS | Status: AC
  Administered 2014-12-26: 09:00:00 via TOPICAL
  Filled 2014-12-26 (×2): qty 60

## 2014-12-26 MED ORDER — ENSURE ENLIVE PO LIQD
237.0000 mL | Freq: Two times a day (BID) | ORAL | Status: DC
  Administered 2014-12-27 – 2014-12-28 (×3): 237 mL via ORAL

## 2014-12-26 MED ORDER — ESCITALOPRAM OXALATE 10 MG PO TABS: 10.0000 mg | ORAL_TABLET | Freq: Every day | ORAL | Status: DC

## 2014-12-26 MED ORDER — CHLORDIAZEPOXIDE HCL 25 MG PO CAPS
25.0000 mg | ORAL_CAPSULE | ORAL | Status: DC
  Administered 2014-12-28: 25 mg via ORAL
  Filled 2014-12-26: qty 1

## 2014-12-26 MED ORDER — CHLORDIAZEPOXIDE HCL 25 MG PO CAPS
25.0000 mg | ORAL_CAPSULE | Freq: Three times a day (TID) | ORAL | Status: AC
  Administered 2014-12-27 (×3): 25 mg via ORAL
  Filled 2014-12-26 (×3): qty 1

## 2014-12-26 MED ORDER — ADULT MULTIVITAMIN W/MINERALS CH
1.0000 | ORAL_TABLET | Freq: Every day | ORAL | Status: DC
  Administered 2014-12-26 – 2014-12-28 (×3): 1 via ORAL
  Filled 2014-12-26 (×5): qty 1

## 2014-12-26 MED ORDER — LORAZEPAM 1 MG PO TABS: 0.0000 mg | ORAL_TABLET | Freq: Four times a day (QID) | ORAL | Status: DC

## 2014-12-26 MED ORDER — ACETAMINOPHEN 325 MG PO TABS: 650.0000 mg | ORAL_TABLET | ORAL | Status: DC | PRN

## 2014-12-26 MED ORDER — METHOCARBAMOL 500 MG PO TABS: 500.0000 mg | ORAL_TABLET | Freq: Two times a day (BID) | ORAL | Status: DC

## 2014-12-26 MED ORDER — CHLORDIAZEPOXIDE HCL 25 MG PO CAPS
25.0000 mg | ORAL_CAPSULE | Freq: Four times a day (QID) | ORAL | Status: DC | PRN
Start: 2014-12-26 — End: 2014-12-28

## 2014-12-26 MED ORDER — TRAZODONE HCL 100 MG PO TABS: 100.0000 mg | ORAL_TABLET | Freq: Every day | ORAL | Status: DC

## 2014-12-26 MED ORDER — ONDANSETRON 4 MG PO TBDP
4.0000 mg | ORAL_TABLET | Freq: Four times a day (QID) | ORAL | Status: DC | PRN
Start: 2014-12-26 — End: 2014-12-28
  Administered 2014-12-26: 4 mg via ORAL
  Filled 2014-12-26: qty 1

## 2014-12-26 MED ORDER — HYDROXYZINE HCL 25 MG PO TABS
25.0000 mg | ORAL_TABLET | Freq: Three times a day (TID) | ORAL | Status: DC | PRN
  Filled 2014-12-26 (×3): qty 10

## 2014-12-26 MED ORDER — LORAZEPAM 1 MG PO TABS: 1.0000 mg | ORAL_TABLET | Freq: Three times a day (TID) | ORAL | Status: DC | PRN

## 2014-12-26 MED ORDER — POTASSIUM CHLORIDE CRYS ER 20 MEQ PO TBCR
20.0000 meq | EXTENDED_RELEASE_TABLET | Freq: Two times a day (BID) | ORAL | Status: AC
  Administered 2014-12-26 – 2014-12-27 (×4): 20 meq via ORAL
  Filled 2014-12-26 (×4): qty 1

## 2014-12-26 MED ORDER — CHLORDIAZEPOXIDE HCL 25 MG PO CAPS: 25.0000 mg | ORAL_CAPSULE | Freq: Every day | ORAL | Status: DC

## 2014-12-26 MED ORDER — THIAMINE HCL 100 MG/ML IJ SOLN
100.0000 mg | Freq: Once | INTRAMUSCULAR | Status: DC
  Filled 2014-12-26: qty 2

## 2014-12-26 MED ORDER — ACETAMINOPHEN 500 MG PO TABS
500.0000 mg | ORAL_TABLET | Freq: Four times a day (QID) | ORAL | Status: DC | PRN
  Administered 2014-12-26: 500 mg via ORAL
  Filled 2014-12-26 (×2): qty 1

## 2014-12-26 MED ORDER — LOPERAMIDE HCL 2 MG PO CAPS: 2.0000 mg | ORAL_CAPSULE | ORAL | Status: DC | PRN

## 2014-12-26 MED ORDER — NAPROXEN 500 MG PO TABS
500.0000 mg | ORAL_TABLET | Freq: Two times a day (BID) | ORAL | Status: DC | PRN
  Administered 2014-12-26: 500 mg via ORAL
  Filled 2014-12-26: qty 1

## 2014-12-26 MED ORDER — ESCITALOPRAM OXALATE 10 MG PO TABS
10.0000 mg | ORAL_TABLET | Freq: Every day | ORAL | Status: DC
  Administered 2014-12-26 – 2014-12-27 (×2): 10 mg via ORAL
  Filled 2014-12-26 (×4): qty 1

## 2014-12-26 MED ORDER — CHLORDIAZEPOXIDE HCL 25 MG PO CAPS
25.0000 mg | ORAL_CAPSULE | Freq: Four times a day (QID) | ORAL | Status: AC
  Administered 2014-12-26 (×4): 25 mg via ORAL
  Filled 2014-12-26 (×4): qty 1

## 2014-12-26 MED ORDER — ACETAMINOPHEN 500 MG PO TABS: 500.0000 mg | ORAL_TABLET | Freq: Four times a day (QID) | ORAL | Status: DC | PRN

## 2014-12-26 MED ORDER — RISPERIDONE 0.5 MG PO TABS: 0.2500 mg | ORAL_TABLET | Freq: Every day | ORAL | Status: DC

## 2014-12-26 MED ORDER — ALUM & MAG HYDROXIDE-SIMETH 200-200-20 MG/5ML PO SUSP: 30.0000 mL | ORAL | Status: DC | PRN

## 2014-12-26 MED ORDER — ZOLPIDEM TARTRATE 5 MG PO TABS: 5.0000 mg | ORAL_TABLET | Freq: Every evening | ORAL | Status: DC | PRN

## 2014-12-26 MED ORDER — RISPERIDONE 0.25 MG PO TABS
0.2500 mg | ORAL_TABLET | Freq: Every day | ORAL | Status: DC
  Administered 2014-12-26: 0.25 mg via ORAL
  Filled 2014-12-26 (×3): qty 1

## 2014-12-26 MED ORDER — IBUPROFEN 400 MG PO TABS: 600.0000 mg | ORAL_TABLET | Freq: Three times a day (TID) | ORAL | Status: DC | PRN

## 2014-12-26 NOTE — ED Notes (Signed)
Patient was to be discharged. Patient dressed self and came to nurses station threatening to kill himself in the parking lot. Reports he needs help and will kill himself if he is discharged. Notified PA of suicidal threats. TTS consult ordered. Patient placed back in room, and instructed to disrobe again. Patient pleasant and expressed gratitude.

## 2014-12-26 NOTE — Discharge Instructions (Signed)
Please use the list below to follow up for detox and to find a primary care provider for further management of your health.   Emergency Department Resource Guide 1) Find a Doctor and Pay Out of Pocket Although you won't have to find out who is covered by your insurance plan, it is a good idea to ask around and get recommendations. You will then need to call the office and see if the doctor you have chosen will accept you as a new patient and what types of options they offer for patients who are self-pay. Some doctors offer discounts or will set up payment plans for their patients who do not have insurance, but you will need to ask so you aren't surprised when you get to your appointment.  2) Contact Your Local Health Department Not all health departments have doctors that can see patients for sick visits, but many do, so it is worth a call to see if yours does. If you don't know where your local health department is, you can check in your phone book. The CDC also has a tool to help you locate your state's health department, and many state websites also have listings of all of their local health departments.  3) Find a Walk-in Clinic If your illness is not likely to be very severe or complicated, you may want to try a walk in clinic. These are popping up all over the country in pharmacies, drugstores, and shopping centers. They're usually staffed by nurse practitioners or physician assistants that have been trained to treat common illnesses and complaints. They're usually fairly quick and inexpensive. However, if you have serious medical issues or chronic medical problems, these are probably not your best option.  No Primary Care Doctor: - Call Health Connect at  [phone] - they can help you locate a primary care doctor that  accepts your insurance, provides certain services, etc. - Physician Referral Service- [phone]  Chronic Pain Problems: Organization         Address  Phone    Notes  [facility]  [phone] Patients need to be referred by their primary care doctor.   Medication Assistance: Organization         Address  Phone   Notes  [facility] Medication [facility] [address] [place], [place] [address] [phone] --Must be a resident of [place] [facility] -- Must have NO insurance coverage whatsoever (no Medicaid/ Medicare, etc.) -- The pt. MUST have a primary care doctor that directs their care regularly and follows them in the community   MedAssist  [phone]   [employer]  [phone]    Agencies that provide inexpensive medical care: Organization         Address  Phone   Notes  [facility]  [phone]   [facility]    [phone]   [facility] [address] [place], [place] [address] [phone]   [facility] [address], [place] [phone]   [facility]anned Parenthood    [phone]   [facility]    [phone]   [facility]  [address], [place] Phone:  [phone], Fax:  [phone] Hours of Operation:  9 am - 6 pm, M-F.  Also accepts Medicaid/Medicare and self-pay.  [facility]  [address], [place] Phone: [phone]  [phone], Fax: [phone]. Hours of Operation:  8:30 am - 5:30 pm, M-F.  Also accepts Medicaid and self-pay.  [facility] [place] [address] [place] Phone: [phone]   [place], [place], [place] [phone], Ext. [phone] Mondays & Thursdays: 7-9 AM.  First 15 patients are seen on a first come, first serve basis.    [place] Providers:  Organization         Address  Phone   Notes  [facility] [address], [place] [phone] Also accepts self-pay patients.  [facility]  [id number] [place], [place]  [phone]   [place], [address], [place] [phone]   [facility] [address] [place] [phone]   Lucianne Lei [address], [place]   [phone] Only accepts [place] Access [place] patients after they have their name applied to their card.   Self-Pay (no insurance) in [facility]:  Organization         Address  Phone   Notes  Sickle Cell Patients, [facility] [place] [phone]   [facility] Urgent Care [place] [phone]   [facility] [place]  [place], [place], [place] [phone]   [facility]/Dr. Osei-Bonsu  [address], [place] or [address], [place] [phone] Phone number for both Dent and [place] locations is the same.  [facility] [address], [place] [phone]   [facility] [address] [place] or [address] [phone]   [facility] [address] [place] [phone], phone; [phone], fax Sees patients 1st and 3rd Saturday of every month.  Must not qualify for public or private insurance (i.e. Medicaid, Medicare, [place] Health Choice, Veterans' Benefits)  Household income should be no more than 200% of the poverty level The clinic cannot treat you if you are pregnant or think you are pregnant  Sexually transmitted diseases are not treated at the clinic.    Dental Care: Organization         Address  Phone  Notes  [facility] Department [facility] [place] [phone] Accepts children up to age 49 who are enrolled in [place] or [place]; pregnant women with a Medicaid card; and children who have  applied for Medicaid or [place] Health Choice, but were declined, whose parents can pay a reduced fee at time of service.  [facility] Department of [facility]  [address], [place] [phone] Accepts children up to age 43 who are enrolled in [place] or [place]; pregnant women with a Medicaid card; and children who have applied for Medicaid or [place] Health Choice, but were declined, whose parents can pay a reduced fee at time of service.  [place] [facility] Access PROGRAM  [place] [phone] Patients are seen by appointment only. Walk-ins are not accepted. [place] will see patients 75 years of age and older. Monday - Tuesday (8am-5pm) Most Wednesdays (8:30-5pm) $30 per visit, cash only  [facility] [facility]  [address], [employer]  [place] [phone] Patients are seen by appointment only. Walk-ins are not accepted. [place] will see patients 52 years of age and older. One Wednesday Evening (Monthly: Volunteer Based).  $30 per visit, cash only  [place]  [phone] for adults; Children under age 58, call Graduate Pediatric Dentistry at [phone]. Children aged 77-14, please call [phone] to request a pediatric application.  Dental services are provided in all areas of dental care including fillings, crowns and bridges, complete and partial dentures, implants, gum treatment, root canals, and extractions. Preventive care is also provided. Treatment is provided to both adults and children. Patients are selected via a lottery and there is often a waiting list.   [facility] [address], [place]  [phone] [email or web address]   [facility] [address] [place], [place] [phone], Ext. [phone] Second and Fourth Thursday of each month, opens at 6:30 AM; Clinic ends at 9 AM.  Patients are seen on a first-come first-served basis, and a  limited number are seen during each clinic.   [facility]  [address] Hillard Danker [place], [place] [phone]   Eligibility Requirements You must have lived in [place], [place], or [place] counties for at least the last three months.   You cannot be eligible for state or federal sponsored [employer], including [employer], [place], or [employer].   You generally cannot be eligible for healthcare insurance through your employer.    How to apply: Eligibility screenings are held every Tuesday and Wednesday afternoon from 1:00 pm until 4:00 pm. You do not need an appointment for the interview!  [facility] [address], [place], [place]   [place]  [place] Department  [place]  [phone]    Behavioral Health Resources in the Community: Intensive Outpatient Programs Organization         Address  Phone  Notes  [place]. [address], [place], [place] [phone]   [facility] [address] [place], [place]   ADS: Alcohol [facility] Drug S[facility]cs [address] [place], [place]   [place] [address]  [place], [place] or [phone]   Substance Abuse Resources Organization         Address  Phone  Notes  Alcohol and Drug Services  [phone]   [place]  [phone]   [facility] [place]   Chinita Pester  [phone]   Residential & Outpatient Substance Abuse Program  [phone]   Psychological Services Organization         Address  Phone  Notes  [facility] [place]  [place]  [phone]   [place] [address] [place] or [phone]    Mobile Crisis Teams Organization          Address  Phone  Notes  [facility], Mobile Crisis Care Unit  [phone]   [facility]  [address] [place], [place]   [place] Levels [address] [place] [phone]    Self-Help/Support Groups Organization         Address  Phone             Notes  [place]. of [place] - variety of support groups  [phone] Call for more information  Narcotics Anonymous (NA), [facility] [address], [employer] [place]  2 meetings at this location   Residential Facilities manager         Address  Phone  Notes  [facility] [place],    [place]  [phone]   [facility]  [address], [place] [id number], [place], [place]   [place], [place] [phone] Admissions: 8am-3pm M-F  Incentives Substance [place] [address]    [employer], [place] [phone]   [facility] [address] [place], [place], [place]   [facility] [address],  [place], [place]   [facility] - Intensive Outpatient [address] Krist[address], [place], [place]   [facility] ([place].,  [place], [place] [phone] or [phone]   [facility] (RTS) [address], [place], [place] Accepts Medicaid  [facility] [address]  [place] [phone] Substance Abuse/Addiction Treatment   [facility] Organization         Address  Phone  Notes  [facility]  [phone]   Domenic Schwab, PhD [address] Arlis Porta [place], [place]   [phone] or [phone]   [place], [place] [phone]   [facility] [address], [place], [place] [phone] Insurance/Medicaid/sponsorship  through [facility] Families [address]                                    [place], [place] [phone] [place] [address], [place] [phone]    Dr. Adele Schilder  [phone]   [facility] Dept. 1) [address] [place] 2) [place] 3)  [place] [address], [place] [phone]  [phone]   [place] [phone] or [phone] (After Hours)
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
## 2014-12-26 NOTE — Progress Notes (Signed)
D:Patient at the medication window on approach.  Patient states, "I slept most of the day."  Patient states he is feeling better at the present moment.  Patient states he has showered several time and was treated for possible scabies.  Patient had been in the milieu interacting with peers.   Patient denies SI/HI and denies AVH. A: Staff to monitor Q 15 mins for safety.  Encouragement and support offered.  Scheduled medications administered per orders.  Naproxen administered prn for toothache. R: Patient remains safe on the unit.  Patient attended group tonight. Patient visible on the unit and interacting with peers.  Patient taking administered medications.

## 2014-12-26 NOTE — ED Notes (Signed)
TTS evaluation in process at this time. Patient in burgundy paper scrubs in room.

## 2014-12-26 NOTE — ED Notes (Signed)
Patient returned to room from x-ray.

## 2014-12-26 NOTE — Progress Notes (Signed)
Patient ID: Maxwell [name], male   DOB: 01/20/1976, 39 y.o.   MRN: [id number] Admission note: D:Patient is a voluntary admission in no acute distress for ETOH abuse and depression. Pt reports he drinks "a lot" and having increase depression, SI without a plan. Pt reports he was here one month ago for same issue. Pt report increase depression due to homelessness. Pt reports losing everything including his dog. Pt denies seizures or blackouts. Pt reports not complaint with prescribed medication. Pt denies SI this time and contract to come to staff before acting on harmful thoughts.  A: Pt admitted to unit per protocol, skin assessment reveal possible scabies. Pt scratching and has bite marks over entire body.  belonging search done. Consent signed by pt. Pt educated on therapeutic milieu rules. Pt was introduced to milieu by nursing staff. Fall risk safety plan explained to the patient. 15 minutes checks started for safety.  R: Pt was receptive to education. Writer offered support.
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
## 2014-12-26 NOTE — Progress Notes (Signed)
D: Pt continues to be very flat and depressed on the unit today. Pt has been  Isolative and been in the room most of time. Pt complained of headache 8/10, Tylenol 500 mg given with good relief. Pt reported not sleep well last night generalized tiredness. His goal is getting back his normal life. Pt reported that his depression was a 8, his hopelessness was a 8, and that his anxiety was a 8. Pt reported being negative SI/HI, no AH/VH noted. A: 15 min checks continued for patient safety. R: Pts safety maintained.

## 2014-12-26 NOTE — ED Provider Notes (Signed)
CSN: [id number]     Arrival date & time 12/25/14  2043 History   First MD Initiated Contact with Patient 12/25/14 2302     Chief Complaint  Patient presents with  . Alcohol Problem  . Drug Problem     (Consider location/radiation/quality/duration/timing/severity/associated sxs/prior Treatment) HPI   39 year old male with hx of polysubstance abuse, prior SI attempt presents requesting for detox for alcohol and drugs.  Pt said he want alcohol detox. Last used was today, and include "4 Locos", usually drink 12 packs or more a day.  He has tried decreasing drug use in the past few days.  Report snorting "speed" 5 days ago, and recent marijuana use today. Currently denies SI/HI/AVH.     Larey SeatFell off ladder approximately 6520ft high and landed on back.  Having mid to lower back pain since.  Denies LOC. Has been taking OTC meds without adequate resolution of pain.  Denies cp/abd pain, or decreased sensation.  He has been able to work. He has been taking over-the-counter medication with some relief. He denies any bowel bladder incontinence or saddle anesthesia.  Patient also notice enlarged lymph nodes in his left groin For more than a month. He denies any dysuria or penile discharge. Patient states he is not sexually active and has no concerns of STD. He denies any history of inguinal hernia.  Past Medical History  Diagnosis Date  . Polysubstance abuse   . H/O suicide attempt     cut wrists at age 39   History reviewed. No pertinent past surgical history. No family history on file. Social History  Substance Use Topics  . Smoking status: Current Every Day Smoker    Types: Cigarettes  . Smokeless tobacco: None  . Alcohol Use: Yes     Comment: occasionally    Review of Systems  All other systems reviewed and are negative.     Allergies  Review of patient's allergies indicates no known allergies.  Home Medications   Prior to Admission medications   Medication Sig Start Date End Date  Taking? Authorizing Provider  escitalopram (LEXAPRO) 10 MG tablet Take 1 tablet (10 mg total) by mouth daily. 11/21/14  Yes Beau FannyJohn C Withrow, FNP  hydrOXYzine (ATARAX/VISTARIL) 25 MG tablet Take 1 tablet (25 mg total) by mouth 3 (three) times daily as needed for anxiety. 11/21/14  Yes Beau FannyJohn C Withrow, FNP  megestrol (MEGACE) 40 MG/ML suspension Take 10 mLs (400 mg total) by mouth daily. 11/21/14  Yes Beau FannyJohn C Withrow, FNP  nicotine (NICODERM CQ - DOSED IN MG/24 HOURS) 21 mg/24hr patch Place 1 patch (21 mg total) onto the skin daily. 11/21/14  Yes Beau FannyJohn C Withrow, FNP  risperiDONE (RISPERDAL) 0.25 MG tablet Take 1 tablet (0.25 mg total) by mouth at bedtime. 11/21/14  Yes Beau FannyJohn C Withrow, FNP  traZODone (DESYREL) 100 MG tablet Take 1 tablet (100 mg total) by mouth at bedtime. 11/21/14  Yes John C Withrow, FNP   BP 128/75 mmHg  Pulse 90  Temp(Src) 98.9 F (37.2 C) (Oral)  Resp 20  SpO2 96% Physical Exam  Constitutional: He is oriented to person, place, and time. He appears well-developed and well-nourished. No distress.  Caucasian male with many tattoos, resting in bed in no acute discomfort.  HENT:  Head: Atraumatic.  Eyes: Conjunctivae are normal.  Neck: Neck supple.  Cardiovascular: Normal rate and regular rhythm.   Pulmonary/Chest: Effort normal and breath sounds normal.  Abdominal: Soft. There is no tenderness.  Genitourinary:  No significant  evidence of inguinal lymphadenopathy or inguinal hernia noted.  Circumcise penis free of lesion or rash. Testicles nontender, normal scrotum.   Musculoskeletal: He exhibits tenderness (Tenderness to  lumbar and parasspinal muscle on palpation without any overlying skin changes, crepitus or step-off. ).  Neurological: He is alert and oriented to person, place, and time.  Equal strength to BLE.  Intact distal pulses  Clinically sober.  Skin: No rash noted.  Psychiatric: He has a normal mood and affect.  Nursing note and vitals reviewed.   ED Course   Procedures (including critical care time)  Patient with history of polysubstance abuse here requesting for alcohol and drug detox. He is currently clinically sober. No evidence to suggest DT, and currently not actively SI/HI.  Labs are reassuring. C/o back injury, xrays neg.  Pt made aware.  RICe therapy discussed.     1:34 AM I offer pt outpt resources for f/u as pt does not have any significant medical problem that would hinders his outpt detox.  However prior to leaving pt now states that he will kill himself by using a knife to cut himself if he is discharge.  Pt specifically states that he is suicidal with a plan.  Will consult TTS for further management. Psych hold and CIWA protocol initiated.    Labs Review Labs Reviewed  COMPREHENSIVE METABOLIC PANEL - Abnormal; Notable for the following:    Potassium 3.3 (*)    All other components within normal limits  ETHANOL - Abnormal; Notable for the following:    Alcohol, Ethyl (B) 237 (*)    All other components within normal limits  URINE RAPID DRUG SCREEN, HOSP PERFORMED - Abnormal; Notable for the following:    Benzodiazepines POSITIVE (*)    Tetrahydrocannabinol POSITIVE (*)    All other components within normal limits  CBC    Imaging Review No results found. I have personally reviewed and evaluated these images and lab results as part of my medical decision-making.   EKG Interpretation None      MDM   Final diagnoses:  Polysubstance abuse  Lower back injury, initial encounter  Suicidal ideation    BP 112/86 mmHg  Pulse 91  Temp(Src) 98.9 F (37.2 C) (Oral)  Resp 20  SpO2 97%     Fayrene Helper, PA-C 12/26/14 0139  Loren Racer, MD 12/29/14 [phone]

## 2014-12-26 NOTE — H&P (Signed)
Psychiatric Admission Assessment Adult  Patient Identification: Spencer Cardinal MRN:  [id number] Date of Evaluation:  12/26/2014 Chief Complaint:  MDD Principal Diagnosis: MDD (major depressive disorder), recurrent, severe, with psychosis ([place]) Diagnosis:   Patient Active Problem List   Diagnosis Date Noted  . Substance induced mood disorder ([place]) [F19.94] 12/26/2014    Priority: High  . MDD (major depressive disorder), recurrent, severe, with psychosis ([place]) [F33.3] 11/18/2014    Priority: High  . 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 Cannabis use disorder, severe, dependence ([place]) [F12.20] 11/19/2014  . 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 Alcohol use disorder, severe, dependence ([place]) [F10.20] 11/19/2014  . Tobacco use disorder [F17.200] 11/19/2014  . Opioid use disorder, mild, abuse [F11.10] 11/19/2014  . Hx of gonorrhea [Z86.19] 11/19/2014   History of Present Illness:Per ED Consult Note: 39 year old male with hx of polysubstance abuse, prior SI attempt presents requesting for detox for alcohol and drugs. Pt said he want alcohol detox. Last used was today, and include "4 Locos", usually drink 12 packs or more a day. He has tried decreasing drug use in the past few days. Report snorting "speed" 5 days ago, and recent marijuana use today. Currently denies SI/HI/AVH.   [place] off ladder approximately 74f high and landed on back. Having mid to lower back pain since. Denies LOC. Has been taking OTC meds without adequate resolution of pain. Denies cp/abd pain, or decreased sensation. He has been able to work. He has been taking over-the-counter medication with some relief. He denies any bowel bladder incontinence or saddle anesthesia.  Patient also notice enlarged lymph nodes in his left groin For more than a month. He denies any dysuria or penile discharge. Patient states he is not sexually active and has no concerns of STD. He denies any history  of inguinal hernia.  On Evaluation: Patient was awake and alert, with a flat affect. Patient reports history of depression and alcohol abuse. States he has been drinking for the past 25 years. Reports history of auditory and visual hallucinations. Denies command. Patient reports he has tried to detox in the past, "however it didn't last long." States is appetite is good and is extremely tired today. Patient reports visual hallucination of his dog. Pt denies suicidal or homicidal ideations at this time. Safety, reassurance and encouragement provided.   ON Evaluation: Associated Signs/Symptoms: Depression Symptoms:  depressed mood, feelings of worthlessness/guilt, hopelessness, suicidal thoughts without plan, loss of energy/fatigue, (Hypo) Manic Symptoms:  Hallucinations, Irritable Mood, Anxiety Symptoms:  Excessive Worry, Social Anxiety, Psychotic Symptoms:  Hallucinations: Auditory Visual PTSD Symptoms: NA Total Time spent with patient: 45 minutes  Past Psychiatric History:PTSD, Alcohol dependence, Substance Abuse disorder  Risk to Self: Is patient at risk for suicide?: Yes Risk to Others:   [facility]:  [facility] Alcohol Screening: Patient refused Alcohol Screening Tool: Yes 1. How often do you have a drink containing alcohol?: 4 or more times a week 2. How many drinks containing alcohol do you have on a typical day when you are drinking?: 10 or more 3. How often do you have six or more drinks on one occasion?: Daily or almost daily Preliminary Score: 8 4. How often during the last year have you found that you were not able to stop drinking once you had started?: Daily or almost daily 5. How often during the last year have you failed to  do what was normally expected from you becasue of drinking?: Weekly 6. How often during the last year have you needed a first drink in  the morning to get yourself going after a heavy drinking session?: Daily or almost daily 7. How often during the last year have you had a feeling of guilt of remorse after drinking?: Daily or almost daily 8. How often during the last year have you been unable to remember what happened the night before because you had been drinking?: Daily or almost daily 9. Have you or someone else been injured as a result of your drinking?: No 10. Has a relative or friend or a doctor or another health worker been concerned about your drinking or suggested you cut down?: Yes, during the last year Alcohol Use Disorder Identification Test Final Score (AUDIT): 35 Brief Intervention: Yes Substance Abuse History in the last 12 months:  Yes.   Consequences of Substance Abuse: Withdrawal Symptoms:   Cramps Diarrhea Headaches Nausea Vomiting Previous Psychotropic Medications: Yes, Seroquel Lexapro Risperdal  Psychological Evaluations: No  Past Medical History:  Past Medical History  Diagnosis Date  . Polysubstance abuse   . H/O suicide attempt     cut wrists at age 37  . Depression   . Schizophrenia ([place])    History reviewed. No pertinent past surgical history. Family History: History reviewed. No pertinent family history. Family Psychiatric  History: Mother schizophrenia-bipolar some alcohol in the family Social History:  History  Alcohol Use  . Yes    Comment: occasionally     History  Drug Use  . Yes  . Special: Marijuana    Comment: lastr use two weeks prior    Social History   Social History  . Marital Status: Single    Spouse Name: N/A  . Number of Children: N/A  . Years of Education: N/A   Social History Main Topics  . Smoking status: Current Every Day Smoker    Types: Cigarettes  . Smokeless tobacco: None  . Alcohol Use: Yes     Comment: occasionally  . Drug Use: Yes    Special: Marijuana     Comment: lastr use two weeks prior  . Sexual Activity: Not Asked   Other Topics  Concern  . None   Social History Narrative  States he is from [place]. He went to school until the 11 th grade. Has done different types of jobs. He is divorced. Has two daughters: now 67 and 20 Y/O has not seen in couple of years. His ex has remarried and gives him a hard time when he tries to see them. He has family in [place] and [place].  Additional Social History:           Allergies:  No Known Allergies Lab Results:  Results for orders placed or performed during the hospital encounter of 12/25/14 (from the past 48 hour(s))  Comprehensive metabolic panel     Status: Abnormal   Collection Time: 12/25/14  9:12 PM  Result Value Ref Range   Sodium 141 135 - 145 mmol/L   Potassium 3.3 (L) 3.5 - 5.1 mmol/L   Chloride 106 101 - 111 mmol/L   CO2 24 22 - 32 mmol/L   Glucose, Bld 75 65 - 99 mg/dL   BUN 9 6 - 20 mg/dL   Creatinine, Ser 0.98 0.61 - 1.24 mg/dL   Calcium 9.2 8.9 - 10.3 mg/dL   Total Protein 7.0 6.5 - 8.1 g/dL   Albumin 3.8 3.5 -  5.0 g/dL   AST 37 15 - 41 U/L   ALT 31 17 - 63 U/L   Alkaline Phosphatase 97 38 - 126 U/L   Total Bilirubin 0.5 0.3 - 1.2 mg/dL   GFR calc non Af Amer >60 >60 mL/min   GFR calc Af Amer >60 >60 mL/min    Comment: (NOTE) The eGFR has been calculated using the CKD EPI equation. This calculation has not been validated in all clinical situations. eGFR's persistently <60 mL/min signify possible Chronic Kidney Disease.    Anion gap 11 5 - 15  Ethanol (ETOH)     Status: Abnormal   Collection Time: 12/25/14  9:12 PM  Result Value Ref Range   Alcohol, Ethyl (B) 237 (H) <5 mg/dL    Comment:        LOWEST DETECTABLE LIMIT FOR SERUM ALCOHOL IS 5 mg/dL FOR MEDICAL PURPOSES ONLY   CBC     Status: None   Collection Time: 12/25/14  9:12 PM  Result Value Ref Range   WBC 8.8 4.0 - 10.5 K/uL   RBC 4.77 4.22 - 5.81 MIL/uL   Hemoglobin 15.6 13.0 - 17.0 g/dL   HCT 45.8 39.0 - 52.0 %   MCV 96.0 78.0 - 100.0 fL   MCH 32.7 26.0 - 34.0 pg   MCHC 34.1  30.0 - 36.0 g/dL   RDW 12.8 11.5 - 15.5 %   Platelets 355 150 - 400 K/uL  Urine rapid drug screen (hosp performed) (Not at [facility])     Status: Abnormal   Collection Time: 12/25/14  9:37 PM  Result Value Ref Range   Opiates NONE DETECTED NONE DETECTED   Cocaine NONE DETECTED NONE DETECTED   Benzodiazepines POSITIVE (A) NONE DETECTED   Amphetamines NONE DETECTED NONE DETECTED   Tetrahydrocannabinol POSITIVE (A) NONE DETECTED   Barbiturates NONE DETECTED NONE DETECTED    Comment:        DRUG SCREEN FOR MEDICAL PURPOSES ONLY.  IF CONFIRMATION IS NEEDED FOR ANY PURPOSE, NOTIFY LAB WITHIN 5 DAYS.        LOWEST DETECTABLE LIMITS FOR URINE DRUG SCREEN Drug Class       Cutoff (ng/mL) Amphetamine      1000 Barbiturate      200 Benzodiazepine   829 Tricyclics       562 Opiates          300 Cocaine          300 THC              50     Metabolic Disorder Labs:  No results found for: HGBA1C, MPG No results found for: PROLACTIN No results found for: CHOL, TRIG, HDL, CHOLHDL, VLDL, LDLCALC  Current Medications: Current Facility-Administered Medications  Medication Dose Route Frequency Provider Last Rate Last Dose  . acetaminophen (TYLENOL) tablet 500 mg  500 mg Oral Q6H PRN Laverle Hobby, PA-C   500 mg at 12/26/14 1308  . alum & mag hydroxide-simeth (MAALOX/MYLANTA) 200-200-20 MG/5ML suspension 30 mL  30 mL Oral Q4H PRN Laverle Hobby, PA-C      . chlordiazePOXIDE (LIBRIUM) capsule 25 mg  25 mg Oral Q6H PRN Laverle Hobby, PA-C      . chlordiazePOXIDE (LIBRIUM) capsule 25 mg  25 mg Oral QID Laverle Hobby, PA-C   25 mg at 12/26/14 1216   Followed by  . [START ON 12/27/2014] chlordiazePOXIDE (LIBRIUM) capsule 25 mg  25 mg Oral TID Laverle Hobby, PA-C  Followed by  . [START ON 12/28/2014] chlordiazePOXIDE (LIBRIUM) capsule 25 mg  25 mg Oral BH-qamhs Spencer E Simon, PA-C       Followed by  . [START ON 12/29/2014] chlordiazePOXIDE (LIBRIUM) capsule 25 mg  25 mg Oral Daily Laverle Hobby, PA-C      . escitalopram (LEXAPRO) tablet 10 mg  10 mg Oral Daily Laverle Hobby, PA-C   10 mg at 12/26/14 3220  . hydrOXYzine (ATARAX/VISTARIL) tablet 25 mg  25 mg Oral TID PRN Laverle Hobby, PA-C      . loperamide (IMODIUM) capsule 2-4 mg  2-4 mg Oral PRN Laverle Hobby, PA-C      . magnesium hydroxide (MILK OF MAGNESIA) suspension 30 mL  30 mL Oral Daily PRN Laverle Hobby, PA-C      . multivitamin with minerals tablet 1 tablet  1 tablet Oral Daily Laverle Hobby, PA-C   1 tablet at 12/26/14 2542  . naproxen (NAPROSYN) tablet 500 mg  500 mg Oral BID PRN Laverle Hobby, PA-C      . nicotine (NICODERM CQ - dosed in mg/24 hours) patch 21 mg  21 mg Transdermal Daily Laverle Hobby, PA-C   21 mg at 12/26/14 7062  . ondansetron (ZOFRAN-ODT) disintegrating tablet 4 mg  4 mg Oral Q6H PRN Laverle Hobby, PA-C   4 mg at 12/26/14 0835  . potassium chloride SA (K-DUR,KLOR-CON) CR tablet 20 mEq  20 mEq Oral BID Laverle Hobby, PA-C   20 mEq at 12/26/14 3762  . risperiDONE (RISPERDAL) tablet 0.25 mg  0.25 mg Oral QHS Spencer E Simon, PA-C      . thiamine (B-1) injection 100 mg  100 mg Intramuscular Once Laverle Hobby, PA-C      . [START ON 12/27/2014] thiamine (VITAMIN B-1) tablet 100 mg  100 mg Oral Daily Laverle Hobby, PA-C      . traZODone (DESYREL) tablet 100 mg  100 mg Oral QHS Laverle Hobby, PA-C       PTA Medications: Prescriptions prior to admission  Medication Sig Dispense Refill Last Dose  . acetaminophen (TYLENOL) 500 MG tablet Take 1 tablet (500 mg total) by mouth every 6 (six) hours as needed. 30 tablet 0 12/25/2014 at Unknown time  . escitalopram (LEXAPRO) 10 MG tablet Take 1 tablet (10 mg total) by mouth daily. 30 tablet 0 12/25/2014 at Unknown time  . hydrOXYzine (ATARAX/VISTARIL) 25 MG tablet Take 1 tablet (25 mg total) by mouth 3 (three) times daily as needed for anxiety. 30 tablet 0 12/25/2014 at Unknown time  . nicotine (NICODERM CQ - DOSED IN MG/24 HOURS) 21  mg/24hr patch Place 1 patch (21 mg total) onto the skin daily. 28 patch 0 12/25/2014 at Unknown time  . risperiDONE (RISPERDAL) 0.25 MG tablet Take 1 tablet (0.25 mg total) by mouth at bedtime. 30 tablet 0 12/25/2014 at Unknown time  . traZODone (DESYREL) 100 MG tablet Take 1 tablet (100 mg total) by mouth at bedtime. 30 tablet 0 12/25/2014 at Unknown time  . megestrol (MEGACE) 40 MG/ML suspension Take 10 mLs (400 mg total) by mouth daily. 240 mL 0 Unknown at Unknown time  . methocarbamol (ROBAXIN) 500 MG tablet Take 1 tablet (500 mg total) by mouth 2 (two) times daily. 20 tablet 0 Unknown at Unknown time    Musculoskeletal: Strength & Muscle Tone: within normal limits Gait & Station: normal Patient leans: N/A  Psychiatric Specialty Exam: Physical Exam  Nursing  note and vitals reviewed. Constitutional: He is oriented to person, place, and time. He appears well-developed.  Eyes: Pupils are equal, round, and reactive to light.  Neck: Normal range of motion.  Cardiovascular: Normal rate.   Neurological: He is alert and oriented to person, place, and time.  Skin: Skin is warm and dry.  Psychiatric: He has a normal mood and affect.    Review of Systems  Constitutional: Positive for weight loss and malaise/fatigue.  Eyes: Negative.   Respiratory: Negative.   Cardiovascular: Negative.   Gastrointestinal: Negative.   Genitourinary: Negative.   Musculoskeletal: Negative.   Skin: Positive for itching and rash.  Neurological: Positive for weakness and headaches.  Endo/Heme/Allergies: Negative.   Psychiatric/Behavioral: Positive for depression, hallucinations and substance abuse. The patient is nervous/anxious and has insomnia.   All other systems reviewed and are negative.   Blood pressure 110/72, pulse 105, temperature 97.8 F (36.6 C), temperature source Oral, resp. rate 18, height 5' 7" (1.702 m), weight 69.4 kg (153 lb).Body mass index is 23.96 kg/(m^2).  General Appearance: Casual  Eye  Contact::  Poor  Speech:  Clear and Coherent and Normal Rate  Volume:  Normal  Mood:  Depressed, Hopeless and Worthless  Affect:  Depressed and Flat  Thought Process:  Coherent and Logical  Orientation:  Full (Time, Place, and Person)  Thought Content:  Rumination  Suicidal Thoughts:  No  Homicidal Thoughts:  No  Memory:  Recent;   Fair  Judgement:  Fair  Insight:  Fair and Lacking  Psychomotor Activity:  Normal  Concentration:  Fair  Recall:  [employer] [place]  Language: Fair  Akathisia:  No  Handed:  Right  AIMS (if indicated):     Assets:  Communication Skills Desire for Improvement Financial Resources/Insurance Social Support Vocational/Educational  ADL's:  Intact  Cognition: WNL  Sleep:        Treatment Plan Summary: Daily contact with patient to assess and evaluate symptoms and progress in treatment and Medication management Medications: Started 20 Meq of Potassium (K-DUR, KLOR-CON) -Continue Lexapro 33m daily for MDD, Risperidone 0.25 Mood Stabilization -Continue Trazodone 1064mqhs prn insomnia -Continue Librium detox protocol and CIWA (as on chart) -Continue Nicotine patches Labs:  - UDS positive for Benzodiazepines, Amphetamines and THC, Reviewed BAL (237), CBC unremarkable, potassium 3.3   Observation Level/Precautions:  15 minute checks  Laboratory:  CBC Chemistry Profile HbAIC UDS UA  Psychotherapy:  Individual and group session  Medications:  lexapro 1018mrisperidone 0.25 librium detox protocol   Consultations:  Psychiatry  Discharge Concerns:  Safety, stabilization, and risk of access to medication and medication stabilization   Estimated LOS: 5-7  Other:     I certify that inpatient services furnished can reasonably be expected to improve the patient's condition.   TanDerrill CenterNP- [facility]/2/20162:27 PM I personally assessed the patient, reviewed the physical exam and labs and formulated the treatment plan IrvGeralyn Flash LugSabra HeckM.D.

## 2014-12-26 NOTE — Progress Notes (Signed)
Pt refused his thiamine injection this morning.

## 2014-12-26 NOTE — [facility] Group Notes (Signed)
[facility] LCSW Group Therapy 12/26/2014  1:15 PM   Type of Therapy: Group Therapy  Participation Level: Did Not Attend. Patient invited to participate but declined.   Samuella BruinKristin Deborrah Mabin, MSW, [employer] IncLCSWA Clinical Social Worker [facility] [phone]

## 2014-12-26 NOTE — Progress Notes (Signed)
NUTRITION ASSESSMENT  Pt identified as at risk on the Malnutrition Screen Tool  INTERVENTION: 1. Educated patient on the importance of nutrition and encouraged intake of food and beverages. 2. Discussed weight goals. 3. Supplements: none at this time  NUTRITION DIAGNOSIS: No nutrition dx at this time.  Goal: Pt to meet >/= 90% of their estimated nutrition needs.  Monitor:  PO intake  Assessment:  Pt seen for MST. Pt admitted for ETOH abuse and depression. Per notes, pt was drinking heavily PTA.   Per review, pt has gained 12 lbs in the past month. No supplements warranted at this time; if pt is not eating well [facility] may be needed.  39 y.o. male  Height: Ht Readings from Last 1 Encounters:  12/26/14 5\' 7"  (1.702 m)    Weight: Wt Readings from Last 1 Encounters:  12/26/14 153 lb (69.4 kg)    Weight Hx: Wt Readings from Last 10 Encounters:  12/26/14 153 lb (69.4 kg)  11/16/14 141 lb (63.957 kg)  11/13/14 144 lb 5 oz (65.46 kg)    BMI:  Body mass index is 23.96 kg/(m^2). Pt meets criteria for normal weight based on current BMI.  Estimated Nutritional Needs: Kcal: 25-30 kcal/kg Protein: > 1 gram protein/kg Fluid: 1 ml/kcal  Diet Order: Diet regular Room service appropriate?: Yes; Fluid consistency:: Thin Pt is also offered choice of unit snacks mid-morning and mid-afternoon.  Pt is eating as desired.   Lab results and medications reviewed.      Trenton GammonJessica Salvatrice Morandi, RD, LDN Inpatient Clinical Dietitian Pager # [phone] After hours/weekend pager # [phone]

## 2014-12-26 NOTE — Tx Team (Signed)
Initial Interdisciplinary Treatment Plan   PATIENT STRESSORS: Financial difficulties Health problems Medication change or noncompliance Substance abuse   PATIENT STRENGTHS: Ability for insight Average or above average intelligence Communication skills General fund of knowledge   PROBLEM LIST: Problem List/Patient Goals Date to be addressed Date deferred Reason deferred Estimated date of resolution  "substance abuse" 12/26/2014     depression 12/26/2014     Substance abuse 12/26/2014     Risk for suicide 12/26/2014                                    DISCHARGE CRITERIA:  Ability to meet basic life and health needs Improved stabilization in mood, thinking, and/or behavior Motivation to continue treatment in a less acute level of care Verbal commitment to aftercare and medication compliance Withdrawal symptoms are absent or subacute and managed without 24-hour nursing intervention  PRELIMINARY DISCHARGE PLAN: Attend PHP/IOP Attend 12-step recovery group Placement in alternative living arrangements  PATIENT/FAMIILY INVOLVEMENT: This treatment plan has been presented to and reviewed with the patient, Maxwell [name], and/or family member, The patient and family have been given the opportunity to ask questions and make suggestions.  JEHU-APPIAH, Shirlee Whitmire K 12/26/2014, 6:17 AM

## 2014-12-26 NOTE — [facility] Group Notes (Signed)
[facility] LCSW Aftercare Discharge Planning Group Note  12/26/2014  8:45 AM  Participation Quality: Did Not Attend. Patient invited to participate but declined.  Aviyon Hocevar, MSW, LCSWA Clinical Social Worker [facility] [phone]   

## 2014-12-26 NOTE — Progress Notes (Signed)
Pt attended the evening NA speaker meeting. Pt was engaged and attentive. 

## 2014-12-27 MED ORDER — ESCITALOPRAM OXALATE 20 MG PO TABS
20.0000 mg | ORAL_TABLET | Freq: Every day | ORAL | Status: DC
  Administered 2014-12-28: 20 mg via ORAL
  Filled 2014-12-27 (×2): qty 1
  Filled 2014-12-27: qty 7

## 2014-12-27 MED ORDER — ACAMPROSATE CALCIUM 333 MG PO TBEC
666.0000 mg | DELAYED_RELEASE_TABLET | Freq: Three times a day (TID) | ORAL | Status: DC
  Administered 2014-12-28 (×2): 666 mg via ORAL
  Filled 2014-12-27: qty 2
  Filled 2014-12-27 (×2): qty 42
  Filled 2014-12-27 (×3): qty 2
  Filled 2014-12-27: qty 42
  Filled 2014-12-27: qty 2

## 2014-12-27 MED ORDER — IBUPROFEN 800 MG PO TABS
800.0000 mg | ORAL_TABLET | Freq: Four times a day (QID) | ORAL | Status: DC | PRN
  Administered 2014-12-27 – 2014-12-28 (×3): 800 mg via ORAL
  Filled 2014-12-27 (×4): qty 1

## 2014-12-27 MED ORDER — BENZOCAINE 10 % MT GEL
Freq: Four times a day (QID) | OROMUCOSAL | Status: DC | PRN
  Administered 2014-12-27 – 2014-12-28 (×3): via OROMUCOSAL
  Filled 2014-12-27: qty 9.4

## 2014-12-27 MED ORDER — DISULFIRAM 250 MG PO TABS
250.0000 mg | ORAL_TABLET | Freq: Every day | ORAL | Status: DC
  Administered 2014-12-27 – 2014-12-28 (×2): 250 mg via ORAL
  Filled 2014-12-27: qty 7
  Filled 2014-12-27 (×4): qty 1

## 2014-12-27 MED ORDER — RISPERIDONE 0.5 MG PO TABS
0.5000 mg | ORAL_TABLET | Freq: Every day | ORAL | Status: DC
  Administered 2014-12-27: 0.5 mg via ORAL
  Filled 2014-12-27: qty 7
  Filled 2014-12-27 (×2): qty 1

## 2014-12-27 NOTE — Progress Notes (Signed)
Patient in bed all morning however up for lunch. Complaining of fatigue and lethargy. VSS and CIWA was a "4" at lunch time. Complaining of L sided wisdom tooth pain of a 10/10. Patient offered support, medicated per orders. Given prn meds for tooth pain and on reassess, patient's pain is a 6/10. Patient irritable at times however pleased to learn he is discharging tomorrow (per his report). Denies SI/HI and is safe. Lawrence MarseillesFriedman, Daielle Melcher Eakes

## 2014-12-27 NOTE — Clinical Social Work Note (Signed)
[facility], Remigio EisenmengerKaren Rudd, [phone]  Santa GeneraAnne Cunningham, LCSW Lead Clinical Social Worker Phone:  [phone]

## 2014-12-27 NOTE — Plan of Care (Signed)
Problem: Alteration in mood; excessive anxiety as evidenced by: Goal: STG-Pt will report an absence of self-harm thoughts/actions (Patient will report an absence of self-harm thoughts or actions)  Outcome: Progressing Patient denies SI, thoughts to self harm.  Problem: Ineffective individual coping Goal: STG-Increase in ability to manage activities of daily living Outcome: Not Progressing Patient has remained in bed, hygiene not completed.

## 2014-12-27 NOTE — Clinical Social Work Note (Signed)
Referrals faxed to [facility] and [facility] for review at patient's request.  Dalasia Predmore, MSW, LCSWA Clinical Social Worker [facility] [phone]  
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
## 2014-12-27 NOTE — Progress Notes (Signed)
[facility] MD Progress Note  12/27/2014 11:48 AM Maxwell [name]  MRN:  [id number] Subjective:  Maxwell [name] states he is still having a hard time. States since he left here last time he has been drinking every day, more so than usual. Would get a day job and use the money to buy more alcohol. He admits that he gets depressed and then he starts hearing the voices usually negative and then he starts drinking. He cant say what the medications were doing for him, at the same time admits he was not taking them every day Principal Problem: MDD (major depressive disorder), recurrent, severe, with psychosis ([place]) Diagnosis:   Patient Active Problem List   Diagnosis Date Noted  . Substance induced mood disorder ([place]) [F19.94] 12/26/2014  . Stimulant use disorder ([place]) [F15.90] 11/19/2014  . Cannabis use disorder, severe, dependence ([place]) [F12.20] 11/19/2014  . 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 Alcohol use disorder, severe, dependence ([place]) [F10.20] 11/19/2014  . Tobacco use disorder [F17.200] 11/19/2014  . Opioid use disorder, mild, abuse [F11.10] 11/19/2014  . Hx of gonorrhea [Z86.19] 11/19/2014  . MDD (major depressive disorder), recurrent, severe, with psychosis ([place]) [F33.3] 11/18/2014   Total Time spent with patient: 30 minutes  Past Psychiatric History: see admission H and P  Past Medical History:  Past Medical History  Diagnosis Date  . Polysubstance abuse   . H/O suicide attempt     cut wrists at age 62  . Depression   . Schizophrenia ([place])    History reviewed. No pertinent past surgical history. Family History: History reviewed. No pertinent family history. Family Psychiatric  History: see Admission H and P Social History:  History  Alcohol Use  . Yes    Comment: occasionally     History  Drug Use  . Yes  . Special: Marijuana    Comment: lastr use two weeks prior    Social History   Social History  . Marital Status: Single    Spouse Name: N/A  . Number  of Children: N/A  . Years of Education: N/A   Social History Main Topics  . Smoking status: Current Every Day Smoker    Types: Cigarettes  . Smokeless tobacco: None  . Alcohol Use: Yes     Comment: occasionally  . Drug Use: Yes    Special: Marijuana     Comment: lastr use two weeks prior  . Sexual Activity: Not Asked   Other Topics Concern  . None   Social History Narrative   Additional Social History:                         Sleep: Fair  Appetite:  Fair  Current Medications: Current Facility-Administered Medications  Medication Dose Route Frequency Provider Last Rate Last Dose  . acetaminophen (TYLENOL) tablet 500 mg  500 mg Oral Q6H PRN Laverle Hobby, PA-C   500 mg at 12/26/14 5784  . alum & mag hydroxide-simeth (MAALOX/MYLANTA) 200-200-20 MG/5ML suspension 30 mL  30 mL Oral Q4H PRN Laverle Hobby, PA-C      . chlordiazePOXIDE (LIBRIUM) capsule 25 mg  25 mg Oral Q6H PRN Laverle Hobby, PA-C      . chlordiazePOXIDE (LIBRIUM) capsule 25 mg  25 mg Oral TID Laverle Hobby, PA-C   25 mg at 12/27/14 0800   Followed by  . [START ON 12/28/2014] chlordiazePOXIDE (LIBRIUM) capsule 25 mg  25 mg Oral BH-qamhs Laverle Hobby, PA-C  Followed by  . [START ON 12/29/2014] chlordiazePOXIDE (LIBRIUM) capsule 25 mg  25 mg Oral Daily Laverle Hobby, PA-C      . escitalopram (LEXAPRO) tablet 10 mg  10 mg Oral Daily Laverle Hobby, PA-C   10 mg at 12/27/14 0800  . feeding supplement (ENSURE ENLIVE) (ENSURE ENLIVE) liquid 237 mL  237 mL Oral BID BM Nicholaus Bloom, MD      . hydrOXYzine (ATARAX/VISTARIL) tablet 25 mg  25 mg Oral TID PRN Laverle Hobby, PA-C      . loperamide (IMODIUM) capsule 2-4 mg  2-4 mg Oral PRN Laverle Hobby, PA-C      . magnesium hydroxide (MILK OF MAGNESIA) suspension 30 mL  30 mL Oral Daily PRN Laverle Hobby, PA-C      . multivitamin with minerals tablet 1 tablet  1 tablet Oral Daily Laverle Hobby, PA-C   1 tablet at 12/27/14 0800  . naproxen  (NAPROSYN) tablet 500 mg  500 mg Oral BID PRN Laverle Hobby, PA-C   500 mg at 12/26/14 2112  . nicotine (NICODERM CQ - dosed in mg/24 hours) patch 21 mg  21 mg Transdermal Daily Laverle Hobby, PA-C   21 mg at 12/27/14 0801  . ondansetron (ZOFRAN-ODT) disintegrating tablet 4 mg  4 mg Oral Q6H PRN Laverle Hobby, PA-C   4 mg at 12/26/14 0835  . potassium chloride SA (K-DUR,KLOR-CON) CR tablet 20 mEq  20 mEq Oral BID Laverle Hobby, PA-C   20 mEq at 12/27/14 0800  . risperiDONE (RISPERDAL) tablet 0.25 mg  0.25 mg Oral QHS Laverle Hobby, PA-C   0.25 mg at 12/26/14 2110  . thiamine (B-1) injection 100 mg  100 mg Intramuscular Once [employer], PA-C      . thiamine (VITAMIN B-1) tablet 100 mg  100 mg Oral Daily Laverle Hobby, PA-C   100 mg at 12/27/14 0800  . traZODone (DESYREL) tablet 100 mg  100 mg Oral QHS Laverle Hobby, PA-C   100 mg at 12/26/14 2110    Lab Results:  Results for orders placed or performed during the hospital encounter of 12/25/14 (from the past 48 hour(s))  Comprehensive metabolic panel     Status: Abnormal   Collection Time: 12/25/14  9:12 PM  Result Value Ref Range   Sodium 141 135 - 145 mmol/L   Potassium 3.3 (L) 3.5 - 5.1 mmol/L   Chloride 106 101 - 111 mmol/L   CO2 24 22 - 32 mmol/L   Glucose, Bld 75 65 - 99 mg/dL   BUN 9 6 - 20 mg/dL   Creatinine, Ser 0.98 0.61 - 1.24 mg/dL   Calcium 9.2 8.9 - 10.3 mg/dL   Total Protein 7.0 6.5 - 8.1 g/dL   Albumin 3.8 3.5 - 5.0 g/dL   AST 37 15 - 41 U/L   ALT 31 17 - 63 U/L   Alkaline Phosphatase 97 38 - 126 U/L   Total Bilirubin 0.5 0.3 - 1.2 mg/dL   GFR calc non Af Amer >60 >60 mL/min   GFR calc Af Amer >60 >60 mL/min    Comment: (NOTE) The eGFR has been calculated using the CKD EPI equation. This calculation has not been validated in all clinical situations. eGFR's persistently <60 mL/min signify possible Chronic Kidney Disease.    Anion gap 11 5 - 15  Ethanol (ETOH)     Status: Abnormal   Collection  Time: 12/25/14  9:12  PM  Result Value Ref Range   Alcohol, Ethyl (B) 237 (H) <5 mg/dL    Comment:        LOWEST DETECTABLE LIMIT FOR SERUM ALCOHOL IS 5 mg/dL FOR MEDICAL PURPOSES ONLY   CBC     Status: None   Collection Time: 12/25/14  9:12 PM  Result Value Ref Range   WBC 8.8 4.0 - 10.5 K/uL   RBC 4.77 4.22 - 5.81 MIL/uL   Hemoglobin 15.6 13.0 - 17.0 g/dL   HCT 45.8 39.0 - 52.0 %   MCV 96.0 78.0 - 100.0 fL   MCH 32.7 26.0 - 34.0 pg   MCHC 34.1 30.0 - 36.0 g/dL   RDW 12.8 11.5 - 15.5 %   Platelets 355 150 - 400 K/uL  Urine rapid drug screen (hosp performed) (Not at [facility])     Status: Abnormal   Collection Time: 12/25/14  9:37 PM  Result Value Ref Range   Opiates NONE DETECTED NONE DETECTED   Cocaine NONE DETECTED NONE DETECTED   Benzodiazepines POSITIVE (A) NONE DETECTED   Amphetamines NONE DETECTED NONE DETECTED   Tetrahydrocannabinol POSITIVE (A) NONE DETECTED   Barbiturates NONE DETECTED NONE DETECTED    Comment:        DRUG SCREEN FOR MEDICAL PURPOSES ONLY.  IF CONFIRMATION IS NEEDED FOR ANY PURPOSE, NOTIFY LAB WITHIN 5 DAYS.        LOWEST DETECTABLE LIMITS FOR URINE DRUG SCREEN Drug Class       Cutoff (ng/mL) Amphetamine      1000 Barbiturate      200 Benzodiazepine   035 Tricyclics       465 Opiates          300 Cocaine          300 THC              50     Physical Findings: AIMS: Facial and Oral Movements Muscles of Facial Expression: None, normal Lips and Perioral Area: None, normal Jaw: None, normal Tongue: None, normal,Extremity Movements Upper (arms, wrists, hands, fingers): None, normal Lower (legs, knees, ankles, toes): None, normal, Trunk Movements Neck, shoulders, hips: None, normal, Overall Severity Severity of abnormal movements (highest score from questions above): None, normal Incapacitation due to abnormal movements: None, normal Patient's awareness of abnormal movements (rate only patient's report): No Awareness, Dental Status Current  problems with teeth and/or dentures?: No Does patient usually wear dentures?: No  CIWA:  CIWA-Ar Total: 0 COWS:     Musculoskeletal: Strength & Muscle Tone: within normal limits Gait & Station: normal Patient leans: normal  Psychiatric Specialty Exam: Review of Systems  Constitutional: Positive for malaise/fatigue.  HENT: Negative.   Eyes: Negative.   Respiratory: Negative.   Cardiovascular: Negative.   Gastrointestinal: Positive for nausea.  Genitourinary: Negative.   Musculoskeletal: Negative.   Skin: Negative.   Neurological: Positive for dizziness and weakness.  Endo/Heme/Allergies: Negative.   Psychiatric/Behavioral: Positive for depression and substance abuse. The patient is nervous/anxious.     Blood pressure 102/80, pulse 94, temperature 97.5 F (36.4 C), temperature source Oral, resp. rate 18, height 5' 7"  (1.702 m), weight 69.4 kg (153 lb).Body mass index is 23.96 kg/(m^2).  General Appearance: Disheveled  Eye Contact::  Minimal  Speech:  Clear and Coherent and Slow  Volume:  Decreased  Mood:  Anxious, Depressed and worried  Affect:  Restricted  Thought Process:  Coherent and Goal Directed  Orientation:  Full (Time, Place, and Person)  Thought Content:  symptoms events worries concerns  Suicidal Thoughts:  Not right now  Homicidal Thoughts:  No  Memory:  Immediate;   Fair Recent;   Fair Remote;   Fair  Judgement:  Fair  Insight:  Shallow  Psychomotor Activity:  Decreased  Concentration:  Fair  Recall:  [employer] of Knowledge:Fair  Language: Fair  Akathisia:  No  Handed:  Right  AIMS (if indicated):     Assets:  Desire for Improvement  ADL's:  Intact  Cognition: WNL  Sleep:  Number of Hours: 6.25   Treatment Plan Summary: Daily contact with patient to assess and evaluate symptoms and progress in treatment and Medication management Supportive approach/coping skills Alcohol dependence; continue the Librium detox protocol/work a relapse prevention  plan Depression; pursue the Lexapro at 10 and reassess for an increase in dose Hallucinations; will continue the Risperdal at 0.25 mg but will need to reassess the dose Will work with CBT/mindfulness Will explore residential treatment options. Last time he was D/C the day he was supposed to be admitted to [facility], he left to stay with a friend and he started drinking that same days so he did not make it to go to [place] [facility] 12/27/2014, 11:48 AM
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
## 2014-12-27 NOTE — [facility] Suicide Risk Assessment (Signed)
[facility] INPATIENT:  Family/Significant Other Suicide Prevention Education  Suicide Prevention Education:  Patient Refusal for Family/Significant Other Suicide Prevention Education: The patient Maxwell [name] has refused to provide written consent for family/significant other to be provided Family/Significant Other Suicide Prevention Education during admission and/or prior to discharge.  Physician notified. SPE reviewed with patient and brochure provided. Patient encouraged to return to hospital if having suicidal thoughts, patient verbalized his/her understanding and has no further questions at this time.   Salar Molden, [place] CarboKristin L 12/27/2014, 10:52 AM

## 2014-12-27 NOTE — [facility] Group Notes (Signed)
[facility] Group Notes:  (Nursing/MHT/Case Management/Adjunct)  Date:  12/27/2014  Time:  0900  Type of Therapy:  Nurse Education - Self Care  Participation Level:  Did Not Attend  Participation Quality:    Affect:    Cognitive:    Insight:    Engagement in Group:    Modes of Intervention:    Summary of Progress/Problems: Patient was invited to group however elected to remain in bed.  Merian CapronFriedman, Amily Depp [facility] 12/27/2014, 0930

## 2014-12-27 NOTE — [facility] Group Notes (Addendum)
.  [facility] LCSW Group Therapy 12/27/2014  1:15 pm   Type of Therapy: Group Therapy Participation Level: Active  Participation Quality: Attentive, Sharing and Supportive  Affect: Appropriate  Cognitive: Alert and Oriented  Insight: Developing/Improving and Engaged  Engagement in Therapy: Developing/Improving and Engaged  Modes of Intervention: Clarification, Confrontation, Discussion, Education, Exploration, Limit-setting, Orientation, Problem-solving, Rapport Building, Dance movement psychotherapisteality Testing, Socialization and Support  Summary of Progress/Problems: The topic for group was balance in life. Today's group focused on defining balance in one's own words, identifying things that can knock one off balance, and exploring healthy ways to maintain balance in life. Group members were asked to provide an example of a time when they felt off balance, describe how they handled that situation,and process healthier ways to regain balance in the future. Group members were asked to share the most important tool for maintaining balance that they [facility] and how they plan to apply this method after discharge. 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 He discussed how he wants to be more involved in his two daughters' lives and how drugs have negatively impacted his life. CSW and other group members provided patient with emotional support and encouragement.   Samuella BruinKristin Tykee Heideman, MSW, [employer]CSWA Clinical Social Worker [facility] [phone]

## 2014-12-28 MED ORDER — BENZOCAINE 10 % MT GEL: Freq: Four times a day (QID) | OROMUCOSAL | Status: AC | PRN

## 2014-12-28 MED ORDER — TRAZODONE HCL 150 MG PO TABS
75.0000 mg | ORAL_TABLET | Freq: Every day | ORAL | Status: DC
  Filled 2014-12-28: qty 7

## 2014-12-28 MED ORDER — ACAMPROSATE CALCIUM 333 MG PO TBEC: 666.0000 mg | DELAYED_RELEASE_TABLET | Freq: Three times a day (TID) | ORAL | Status: AC

## 2014-12-28 MED ORDER — HYDROXYZINE HCL 25 MG PO TABS: 25.0000 mg | ORAL_TABLET | Freq: Three times a day (TID) | ORAL | Status: AC | PRN

## 2014-12-28 MED ORDER — NICOTINE 21 MG/24HR TD PT24: 21.0000 mg | MEDICATED_PATCH | Freq: Every day | TRANSDERMAL | Status: AC

## 2014-12-28 MED ORDER — DISULFIRAM 250 MG PO TABS: 250.0000 mg | ORAL_TABLET | Freq: Every day | ORAL | Status: AC

## 2014-12-28 MED ORDER — TRAZODONE HCL 100 MG PO TABS: 100.0000 mg | ORAL_TABLET | Freq: Every day | ORAL | Status: AC

## 2014-12-28 MED ORDER — RISPERIDONE 0.5 MG PO TABS: 0.5000 mg | ORAL_TABLET | Freq: Every day | ORAL | Status: AC

## 2014-12-28 MED ORDER — ESCITALOPRAM OXALATE 20 MG PO TABS: 20.0000 mg | ORAL_TABLET | Freq: Every day | ORAL | Status: AC
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
## 2014-12-28 NOTE — BHH Group Notes (Signed)
BHH LCSW Aftercare Discharge Planning Group Note  12/28/2014  8:45 AM  Participation Quality: Did Not Attend. Patient invited to participate but declined.  Kamori Kitchens, MSW, LCSWA Clinical Social Worker [facility] [phone]   

## 2014-12-28 NOTE — Progress Notes (Signed)
D: Pt with flat affect is alert and oriented x4. Pt complained of moderate depression and anxiety. Pt also complained of severe L. wisdom tooth pain of 8 on a 0-10 He states, "I think my wisdom tooth is coming out; the pain is like an 8." Pt however denies SI, HI, and AVH. Pt was cooperative and appreciative and calm through the assessment.  A: Pt was encouraged to attend group. Medications offered as prescribed.  Support, encouragement, and safe environment provided.  15-minute safety checks continue.  R: Pt was med compliant.  Pt attended group. Safety checks continue.

## 2014-12-28 NOTE — Progress Notes (Signed)
Pt discharged home, bus pass given prior to leaving facility.  Pt was stable and appreciative at time of discharge.  All discharge paperwork, prescriptions/ weeks supply of meds were given and valuables returned.  Verbal understanding expressed, pt given opportunity to express concerns and ask questions.  Denies SI and A/V hallucinations.

## 2014-12-28 NOTE — Tx Team (Signed)
Interdisciplinary Treatment Plan Update (Adult) Date: 12/28/2014    Time Reviewed: 9:30 AM  Progress in Treatment: Attending groups: Continuing to assess, patient new to milieu Participating in groups: Continuing to assess, patient new to milieu Taking medication as prescribed: Yes Tolerating medication: Yes Family/Significant other contact made: No, patient has declined for collateral contact Patient understands diagnosis: Yes Discussing patient identified problems/goals with staff: Yes Medical problems stabilized or resolved: Yes Denies suicidal/homicidal ideation: Yes Issues/concerns per patient self-inventory: Yes Other:  New problem(s) identified: N/A  Discharge Plan or Barriers: Home with outpatient follow up.  Reason for Continuation of Hospitalization:  Depression Anxiety Medication Stabilization   Comments: N/A  Estimated length of stay: Discharge anticipated for today 12/28/14.   Trigger is a 39 YO Caucasian male who was admitted for polysubstance abuse- ETOH, THC, and crack. He is hoping to get into rehab and is agreeable to referrals to [facility] and [facility]. Patient denies any current supports in his life. His stressors include: addiction, lack of social supports, income, or stable housing. He can benefit from crises stabilization, medication management, therapeutic milieu and referral for services.   Review of initial/current patient goals per problem list:  1. Goal(s): Patient will participate in aftercare plan   Met: Yes   Target date: 3-5 days post admission date   As evidenced by: Patient will participate within aftercare plan AEB aftercare provider and housing plan at discharge being identified.  11/4: Goal met: Patient plans to return home to follow up with outpatient services.     2. Goal (s): Patient will exhibit decreased depressive symptoms and suicidal ideations.   Met: Adequate for discharge per MD   Target date: 3-5 days post admission  date   As evidenced by: Patient will utilize self rating of depression at 3 or below and demonstrate decreased signs of depression or be deemed stable for discharge by MD.  11/4: Adequate for discharge per MD. Patient reports feeling safe for discharge, denies SI.     4. Goal(s): Patient will demonstrate decreased signs of withdrawal due to substance abuse   Met: Adequate for discharge per MD   Target date: 3-5 days post admission date   As evidenced by: Patient will produce a CIWA/COWS score of 0, have stable vitals signs, and no symptoms of withdrawal  11/4:  Adequate for discharge. Patient with CIWA score of 2, experiencing anxiety and tremor but reports feeling safe for discharge.       Attendees: Patient:    Family:    Physician: Dr. Parke Poisson; Dr. Sabra Heck 12/28/2014 9:30 AM  Nursing: Neldon Mc, RN 12/28/2014 9:30 AM  Clinical Social Worker: Tilden Fossa, LCSWA 12/28/2014 9:30 AM  Other: Peri Maris, LCSWA 12/28/2014 9:30 AM  Other: Norberto Sorenson, [place]  12/28/2014 9:30 AM  Other:  12/28/2014 9:30 AM  Other: 12/28/2014 9:30 AM          Scribe for Treatment Team:  Tilden Fossa, MSW, [employer] [phone]

## 2014-12-29 ENCOUNTER — Emergency Department (HOSPITAL_COMMUNITY)
Admission: EM | Admit: 2014-12-29 | Discharge: 2014-12-29 | Disposition: A | Discharge status: Home / Self Care | Payer: Self-pay | Attending: Emergency Medicine | Admitting: Emergency Medicine

## 2014-12-29 ENCOUNTER — Emergency Department (HOSPITAL_COMMUNITY): Payer: Self-pay

## 2014-12-29 ENCOUNTER — Encounter (HOSPITAL_COMMUNITY): Payer: Self-pay | Admitting: *Deleted

## 2014-12-29 DIAGNOSIS — Y9389 Activity, other specified: Secondary | ICD-10-CM | POA: Insufficient documentation

## 2014-12-29 DIAGNOSIS — F329 Major depressive disorder, single episode, unspecified: Secondary | ICD-10-CM | POA: Insufficient documentation

## 2014-12-29 DIAGNOSIS — Y998 Other external cause status: Secondary | ICD-10-CM | POA: Insufficient documentation

## 2014-12-29 DIAGNOSIS — S02402A Zygomatic fracture, unspecified, initial encounter for closed fracture: Secondary | ICD-10-CM | POA: Insufficient documentation

## 2014-12-29 DIAGNOSIS — S02401A Maxillary fracture, unspecified, initial encounter for closed fracture: Secondary | ICD-10-CM | POA: Insufficient documentation

## 2014-12-29 DIAGNOSIS — S022XXA Fracture of nasal bones, initial encounter for closed fracture: Secondary | ICD-10-CM | POA: Insufficient documentation

## 2014-12-29 DIAGNOSIS — Y9289 Other specified places as the place of occurrence of the external cause: Secondary | ICD-10-CM | POA: Insufficient documentation

## 2014-12-29 DIAGNOSIS — Z79899 Other long term (current) drug therapy: Secondary | ICD-10-CM | POA: Insufficient documentation

## 2014-12-29 DIAGNOSIS — S0230XA Fracture of orbital floor, unspecified side, initial encounter for closed fracture: Secondary | ICD-10-CM | POA: Insufficient documentation

## 2014-12-29 DIAGNOSIS — Z720 Tobacco use: Secondary | ICD-10-CM | POA: Insufficient documentation

## 2014-12-29 MED ORDER — OXYCODONE-ACETAMINOPHEN 5-325 MG PO TABS: 1.0000 | ORAL_TABLET | Freq: Four times a day (QID) | ORAL | Status: AC | PRN

## 2014-12-29 MED ORDER — HYDROCODONE-ACETAMINOPHEN 5-325 MG PO TABS
1.0000 | ORAL_TABLET | Freq: Once | ORAL | Status: AC
  Administered 2014-12-29: 1 via ORAL
  Filled 2014-12-29: qty 1

## 2014-12-29 MED ORDER — HYDROMORPHONE HCL 1 MG/ML IJ SOLN
1.0000 mg | Freq: Once | INTRAMUSCULAR | Status: AC
  Administered 2014-12-29: 1 mg via INTRAMUSCULAR
  Filled 2014-12-29: qty 1

## 2014-12-29 MED ORDER — AMOXICILLIN-POT CLAVULANATE 875-125 MG PO TABS: 1.0000 | ORAL_TABLET | Freq: Two times a day (BID) | ORAL | Status: AC
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
## 2014-12-29 NOTE — ED Notes (Signed)
Awake. Verbally responsive. A/O x4. Resp even and unlabored. No audible adventitious breath sounds noted. ABC's intact.  
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
## 2014-12-30 ENCOUNTER — Telehealth (HOSPITAL_COMMUNITY): Payer: Self-pay

## 2015-01-01 NOTE — Progress Notes (Signed)
  [facility] :  Will you be returning to the same living situation after discharge:  Yes,  patient plans to return to previous living situation At discharge, do you have transportation home?: Yes,  patient reports access to transportation Do you have the ability to pay for your medications: Yes,  patient will be provided with prescriptions  Release of information consent forms completed and in the chart;  Patient's signature needed at discharge.  Patient to Follow up at: Follow-up Information    Follow up with [facility].   Why:  Referral sent 11/3   Contact information:   [address] [place] [phone]      [facility] with [facility] .   Why:  Referral sent 11/3   Contact information:   [address] [place], [place] [address] Phone: [phone]      [facility]   Specialty:  Behavioral Health   Why:  Walk-in clinic Monday-Friday between 8am to 3pm for assessment for therapy and medication management services. Arrive early in the morning in order to be seen as soon as possible.   Contact information:   [address] [place] [address] [phone]       Follow [facility] with [facility]   Why:  Please contact to schedule an appointment for therapy and medication management services [place].   Contact information:   [address],  [place], [place] [address] [phone]      Next level of care provider has access to [facility] Link:no  Patient denies SI/HI: Yes,  denies    Safety Planning and Suicide Prevention discussed: Yes,  with patient  Have you used any form of tobacco in the last 30 days? (Cigarettes, Smokeless Tobacco, Cigars, and/or Pipes): Yes  Has patient been referred to the Quitline?: Patient refused referral  Kippy Melena, [place] L 01/01/2015, 8:22 AM
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
